# Patient Record
Sex: Male | Born: 1985 | Hispanic: Yes | Marital: Married | Smoking: Never smoker
Health system: Southern US, Community
[De-identification: ages and names within clinical notes are randomized; demographics above are authoritative.]

## PROBLEM LIST (undated history)

## (undated) DIAGNOSIS — J45909 Unspecified asthma, uncomplicated: Secondary | ICD-10-CM

## (undated) DIAGNOSIS — I10 Essential (primary) hypertension: Secondary | ICD-10-CM

## (undated) HISTORY — DX: Unspecified asthma, uncomplicated: J45.909

## (undated) HISTORY — DX: Essential (primary) hypertension: I10

---

## 2017-04-17 ENCOUNTER — Ambulatory Visit (INDEPENDENT_AMBULATORY_CARE_PROVIDER_SITE_OTHER): Payer: BLUE CROSS/BLUE SHIELD | Admitting: Family Medicine

## 2017-04-17 ENCOUNTER — Telehealth: Payer: Self-pay

## 2017-04-17 ENCOUNTER — Ambulatory Visit (HOSPITAL_COMMUNITY)
Admission: RE | Admit: 2017-04-17 | Discharge: 2017-04-17 | Disposition: A | Discharge status: Home / Self Care | Payer: BLUE CROSS/BLUE SHIELD | Source: Ambulatory Visit | Attending: Family Medicine | Admitting: Family Medicine

## 2017-04-17 ENCOUNTER — Encounter: Payer: Self-pay | Admitting: Family Medicine

## 2017-04-17 VITALS — BP 144/80 | HR 87 | Temp 98.4°F | Resp 16 | Ht 71.0 in | Wt 262.0 lb

## 2017-04-17 DIAGNOSIS — I10 Essential (primary) hypertension: Secondary | ICD-10-CM | POA: Diagnosis not present

## 2017-04-17 DIAGNOSIS — S3992XD Unspecified injury of lower back, subsequent encounter: Secondary | ICD-10-CM

## 2017-04-17 DIAGNOSIS — Z23 Encounter for immunization: Secondary | ICD-10-CM | POA: Diagnosis not present

## 2017-04-17 DIAGNOSIS — R809 Proteinuria, unspecified: Secondary | ICD-10-CM

## 2017-04-17 DIAGNOSIS — M545 Low back pain, unspecified: Secondary | ICD-10-CM

## 2017-04-17 DIAGNOSIS — Z131 Encounter for screening for diabetes mellitus: Secondary | ICD-10-CM

## 2017-04-17 DIAGNOSIS — S3992XA Unspecified injury of lower back, initial encounter: Secondary | ICD-10-CM

## 2017-04-17 DIAGNOSIS — X58XXXD Exposure to other specified factors, subsequent encounter: Secondary | ICD-10-CM | POA: Insufficient documentation

## 2017-04-17 LAB — POCT URINALYSIS DIP (DEVICE)
Bilirubin Urine: NEGATIVE
Glucose, UA: NEGATIVE mg/dL
Ketones, ur: NEGATIVE mg/dL
Leukocytes, UA: NEGATIVE
NITRITE: NEGATIVE
Specific Gravity, Urine: 1.025 (ref 1.005–1.030)
Urobilinogen, UA: 0.2 mg/dL (ref 0.0–1.0)
pH: 7 (ref 5.0–8.0)

## 2017-04-17 LAB — POCT GLYCOSYLATED HEMOGLOBIN (HGB A1C): HEMOGLOBIN A1C: 5.2

## 2017-04-17 MED ORDER — MELOXICAM 7.5 MG PO TABS: 7.5000 mg | ORAL_TABLET | Freq: Every day | ORAL | 0 refills | Status: DC

## 2017-04-17 NOTE — Telephone Encounter (Signed)
Called no answer. Left a message for patient to return call and left call back number. Thanks!

## 2017-04-17 NOTE — Telephone Encounter (Signed)
-----   Message from Massie MaroonLachina M Hollis, OregonFNP sent at 04/17/2017  2:35 PM EST ----- Regarding: lab results Patient that x-ray was negative.  We will start a trial of meloxicam 7.5 mg daily.  Follow-up in office as needed for back pain.  He can return to work on Monday, 04/20/2017 with no restrictions   Thanks

## 2017-04-17 NOTE — Progress Notes (Signed)
Chief Complaint  Patient presents with  . Hypertension  . Oral Swelling    swollen "bump" in throat   . Back Pain  . Numbness    in toes      Subjective:    Patient ID: Ricky Velez, male    DOB: 1985-12-05, 32 y.o.   MRN: [id number]  HPI 32 year old male presents to establish care. Patient recently moved [place] from [place]. Patient has not had primary care since relocating to area. He has mostly been utilizing urgent care for all primary needs. He has a history of hypertension. He has been taking losartan-hydrochlorothiazide 100-25 mg daily consistently. He does not exercise routinely or follow a balanced diet. He endorses weight gain over the past several months. He denies headache, blurred vision, shortness of breath, chest pain, nausea, vomiting, or diarrhea.  Patient is also complaining of back pain. He sustained an injury to back 5 days ago.   Patient was pushing a pallet of empty boxes and felt a "shock" to lower back.  Pain intensity 4/5 described as intermitted and aching. Patient says that pain is aggravated by standing, lifting objects, stooping, and bending. He has taken OTC Ibuprofen without sustained relief  Past Medical History:  Diagnosis Date  . Asthma   . Hypertension    Social History   Socioeconomic History  . Marital status: Married    Spouse name: Not on file  . Number of children: Not on file  . Years of education: Not on file  . Highest education level: Not on file  Social Needs  . Financial resource strain: Not on file  . Food insecurity - worry: Not on file  . Food insecurity - inability: Not on file  . Transportation needs - medical: Not on file  . Transportation needs - non-medical: Not on file  Occupational History  . Not on file  Tobacco Use  . Smoking status: Never Smoker  . Smokeless tobacco: Never Used  Substance and Sexual Activity  . Alcohol use: No    Frequency: Never  . Drug use: No  . Sexual activity: Not on file   Other Topics Concern  . Not on file  Social History Narrative  . Not on file   Review of Systems  Constitutional: Negative for fatigue.  HENT: Positive for mouth sores.   Eyes: Negative.   Respiratory: Negative.   Cardiovascular: Negative.   Gastrointestinal: Negative.   Endocrine: Negative.   Genitourinary: Negative.   Musculoskeletal: Positive for back pain.  Allergic/Immunologic: Negative.   Neurological: Negative.   Hematological: Negative.   Psychiatric/Behavioral: Negative.        Objective:   Physical Exam  Constitutional: He is oriented to person, place, and time. He appears well-developed and well-nourished.  HENT:  Head: Normocephalic and atraumatic.  Right Ear: External ear normal.  Left Ear: External ear normal.  Nose: Nose normal.  Mouth/Throat: Oropharynx is clear and moist.  Eyes: Pupils are equal, round, and reactive to light.  Neck: Normal range of motion. Neck supple.  Cardiovascular: Normal rate, regular rhythm, normal heart sounds and intact distal pulses.  Pulmonary/Chest: Effort normal and breath sounds normal.  Abdominal: Soft. Bowel sounds are normal.  Musculoskeletal:       Lumbar back: He exhibits decreased range of motion. He exhibits no swelling, no edema, no pain and no spasm.  Neurological: He is alert and oriented to person, place, and time. He has normal reflexes.  Skin: Skin is warm and  dry.  Psychiatric: He has a normal mood and affect. His behavior is normal. Judgment and thought content normal.      BP (!) 144/80 (BP Location: Left Arm, Patient Position: Sitting, Cuff Size: Large) Comment: manually  Pulse 87   Temp 98.4 F (36.9 C) (Oral)   Resp 16   Ht 5\' 11"  (1.803 m)   Wt 262 lb (118.8 kg)   SpO2 98%   BMI 36.54 kg/m  Assessment & Plan:  1. Essential hypertension Blood pressure is above goal on current medications regimen.  Recommend starting DASH diet - Continue medication, monitor blood pressure at home. Continue DASH  diet. Reminder to go to the ER if any CP, SOB, nausea, dizziness, severe HA, changes vision/speech, left arm numbness and tingling and jaw pain.    - losartan-hydrochlorothiazide (HYZAAR) 100-25 MG tablet; Take 1 tablet by mouth daily. - Ambulatory referral to Ophthalmology - Comprehensive metabolic panel - CBC - Microalbumin/Creatinine Ratio, Urine  2. Screening for diabetes mellitus  - POCT urinalysis dip (device) - HgB A1c  3. Need for Tdap vaccination  - Tdap vaccine greater than or equal to 7yo IM  4. Proteinuria, unspecified type  - losartan-hydrochlorothiazide (HYZAAR) 100-25 MG tablet; Take 1 tablet by mouth daily.  5. Injury of back, subsequent encounter - DG Lumbar Spine Complete; Future - meloxicam (MOBIC) 7.5 MG tablet; Take 1 tablet (7.5 mg total) by mouth daily.  Dispense: 30 tablet; Refill: 0  6. Bilateral low back pain without sciatica, unspecified chronicity - POCT urinalysis dip (device) - meloxicam (MOBIC) 7.5 MG tablet; Take 1 tablet (7.5 mg total) by mouth daily.  Dispense: 30 tablet; Refill: 0   RTC; 3 months for hypetension  Nolon Nations  MSN, FNP-C Patient Care [facility] [address] [place], [place] [address] [phone]

## 2017-04-17 NOTE — Patient Instructions (Addendum)
Low back pain we will start a trial of meloxicam 7.5 mg daily.  Will review x-ray spine and follow-up by phone.  We will also review laboratory values and follow-up by phone.  Recommend back exercises as discussed for back pain related to injury.  Also recommend applying warm moist compresses to lower back as needed.  Recommend a lowfat, low carbohydrate diet divided over 5-6 small meals, increase water intake to 6-8 glasses, and 150 minutes per week of cardiovascular exercise.   Your lab result was negative for type 2 diabetes.  Meloxicam capsules Qu es este medicamento? MELOXICAM es un medicamento antiinflamatorio no esteroideo (AINE). Se utiliza para reducir la inflamacin y Corporate treasurer. Se utiliza para la osteoartritis. Este medicamento puede ser utilizado para otros usos; si tiene alguna pregunta consulte con su proveedor de atencin mdica o con su farmacutico. MARCAS COMUNES: Vivlodex Qu le debo informar a mi profesional de la salud antes de tomar este medicamento? Necesita saber si usted presenta alguno de los siguientes problemas o situaciones: -trastornos sanguneos -fuma cigarrillos -ciruga de bypass coronario con injerto en las ltimas 2 semanas -consume ms de 3 bebidas alcohlicas por da -enfermedad cardiaca -alta presin sangunea -antecedentes de sangrado estomacal -enfermedad renal -enfermedad heptica -enfermedad pulmonar o respiratoria, como asma -problemas intestinales o estomacales -una reaccin alrgica o inusual al meloxicam, a la aspirina, a otros AINEs, otros medicamentos, alimentos, colorantes o conservantes -si est embarazada o buscando quedar embarazada -si est amamantando a un beb Cmo debo utilizar este medicamento? Tome este medicamento por va oral con un vaso lleno de agua. Siga las instrucciones de la etiqueta del [place]. Puede tomarlo con o sin alimentos. Si el Social worker, tmelo con alimentos. Tome su  medicamento a intervalos regulares. No tome su medicamento con una frecuencia mayor a la indicada. No deje de tomarlo excepto si as lo indica su mdico. Su farmacutico le dar una gua del medicamento especial con cada receta y relleno. Asegrese de leer esta informacin cada vez cuidadosamente. Hable con su pediatra para informarse acerca del uso de este medicamento en nios. Puede requerir Customer service manager. Los pacientes mayores de 65 aos de edad pueden presentar reacciones ms fuertes y Pension scheme manager dosis [employer]. Sobredosis: Pngase en contacto inmediatamente con un centro toxicolgico o una sala de urgencia si usted cree que haya tomado demasiado medicamento. ATENCIN: [employer] es solo para usted. No comparta este medicamento con nadie. Qu sucede si me olvido de una dosis? Si se olvida una dosis, tmela lo antes posible. Si es casi la hora de su dosis siguiente, tome slo esa dosis. No tome dosis adicionales o dobles. Qu puede interactuar con este medicamento? No tome esta medicina con ninguno de los siguientes medicamentos: -cidofovir -quetorolac Esta medicina tambin puede interactuar con los siguientes medicamentos: -aspirina y medicamentos tipo aspirina -ciertos medicamentos para la presin sangunea, enfermedad cardiaca, pulso cardiaco irregular -ciertos medicamentos para la depresin, ansiedad o trastornos psicticos -ciertos medicamentos que tratan o previenen cogulos sanguneos, como warfarina, enoxaparina, dalteparina, apixaban, dabigatran, rivaroxaban -ciclosporina -digoxina -diurticos -metotrexato -otros AINEs, medicamentos para el dolor e inflamacin, como ibuprofeno y naproxeno -pemetrexed Puede ser que esta lista no menciona todas las posibles interacciones. Informe a su profesional de [employer] de [employer] Inc productos a base de hierbas, medicamentos de [place] o suplementos nutritivos que est tomando. Si usted fuma, consume bebidas alcohlicas o si utiliza  drogas ilegales, indqueselo tambin a su profesional de [employer]. Algunas sustancias pueden interactuar con su medicamento.  A qu debo estar atento al usar [employer] Corporation? Informe a su mdico o su profesional de la salud si sus sntomas no comienzan a mejorar o si empeoran. No tome otros medicamentos que contienen aspirina, ibuprofeno o naproxeno con [employer]. Efectos secundarios tales como AT[employer], nusea, o lceras pueden ser ms probables que ocurran. No se debe tomar muchos medicamentos de venta libre con [employer] Corporation. Este medicamento puede causar lceras y Landscape architect en el estmago y los intestinos en cualquier momento durante el [place]. Esto puede suceder sin aviso y puede causar la muerte. Riesgo es mayor cuando se toma este medicamento durante mucho tiempo. Fumar, beber alcohol, ser de [address] y [address] tambin pueden [employer]. Llame a su mdico de inmediato si tiene dolor de estmago o sangre en su vmito o heces. Este medicamento no previene ataques al corazn o derrame cerebral. Louann Liv, este medicamento puede aumentar la posibilidad de un ataque al corazn o derrame cerebral. La posibilidad puede aumentar con el uso prolongado de este medicamento y en personas que tienen enfermedades cardiacas. Si usted toma aspirina para prevenir ataques al corazn o derrame cerebral, hable con su mdico o profesional de [employer]. Qu efectos secundarios puedo tener al [employer] este medicamento? Efectos secundarios que debe informar a su mdico o a Producer, television/film/video de la salud tan pronto como sea posible: Therapist, art, como erupcin cutnea, picazn o urticarias, e hinchazn de la cara, los labios o la lengua nuseas, vmito signos y sntomas de un cogulo sanguneo, tales como problemas respiratorios; cambios en la visin; dolor en el pecho; dolor de cabeza severo, repentino; dolor, hinchazn, calor en la pierna; dificultad para hablar; entumecimiento  o debilidad repentina de la cara, el brazo o la pierna signos y sntomas de Landscape architect, tales como heces con Retail buyer o de color negro y Water engineer alquitranado; [place] de color rojo o Child psychotherapist oscuro; escupir sangre o Biomedical engineer que tiene el aspecto de granos de caf molido; Regulatory affairs officer rojas en la piel; sangrado o moretones inusuales en los ojos, las encas o la nariz signos y sntomas de lesin al hgado, como orina amarilla oscura o Child psychotherapist; sensacin general de estar enfermo o sntomas gripales; heces claras; prdida de apetito; nuseas; dolor en la regin abdominal superior derecha; cansancio o debilidad inusual; color amarillento de los ojos o la piel signos y sntomas de un derrame cerebral, tales como cambios en la visin; confusin; dificultad para hablar o entender; dolores de cabeza severos; entumecimiento o debilidad repentina de la cara, el brazo o la pierna; dificultad para caminar; Research scientist (life sciences); prdida del equilibrio o la coordinacin Efectos secundarios que generalmente no requieren Psychologist, prison and probation services (infrmelos a su mdico o a Producer, television/film/video de la salud si persisten o si son molestos): estreimiento diarrea gases Puede ser que esta lista no menciona todos los posibles efectos secundarios. Comunquese a su mdico por asesoramiento mdico [employer]. Usted puede informar los efectos secundarios a la FDA por telfono al 1-800-FDA-1088. Dnde debo guardar mi medicina? Mantngala fuera del alcance de los nios. Gurdela a [employer]I, entre 15 y 30 grados C (86 y 95 grados F). Deseche todo el medicamento que no haya utilizado, despus de la fecha de vencimiento. ATENCIN: Este folleto es un resumen. Puede ser que no cubra toda la posible informacin. Si usted tiene preguntas acerca de esta medicina, consulte con su mdico, su farmacutico o su profesional de Radiographer, therapeutic.  2018 Elsevier/Gold Standard (2015-06-04 00:00:00)  Ejercicios para la espalda (Back  Exercises) Si tiene dolor  de espalda, haga estos ejercicios 2 o 3veces por da, o como se lo haya indicado el mdico. Cuando el dolor desaparezca, hgalos una vez por da, pero haga ms repeticiones de cada ejercicio. Si no le duele la espalda, haga estos ejercicios una vez por da o como se lo haya indicado el mdico. EJERCICIOS Rodilla al pecho Repita estos pasos 3 o 5veces seguidas con cada pierna: 1. Acustese boca arriba sobre una cama dura o sobre el suelo con las piernas extendidas. 2. Lleve una rodilla al pecho. 3. Mantenga la rodilla contra el pecho. Para lograrlo tmese la rodilla o el muslo. 4. Tire de la rodilla hasta sentir una elongacin suave en la parte baja de la espalda. 5. Mantenga la elongacin durante 10 a 30segundos. 6. Suelte y extienda la pierna lentamente. Inclinacin de la pelvis Repita estos pasos 5 o 10veces seguidas: 1. Acustese boca arriba sobre una cama dura o sobre el suelo con las piernas extendidas. 2. Flexione las rodillas de manera que apunten al techo. Los pies deben estar apoyados en el suelo. 3. Contraiga los msculos de la parte baja del vientre (abdomen) para empujar la zona lumbar contra el suelo. Este movimiento har que el cccix apunte hacia el techo, en lugar de apuntar hacia abajo en direccin a los pies o al suelo. 4. Mantenga esta posicin durante 5 a 10segundos mientras contrae suavemente los msculos y respira con normalidad. El perro y el gato Repita estos pasos hasta que la zona lumbar se curve con ms facilidad: 1. Apoye las palmas de las manos y las rodillas sobre una superficie firme. Las manos deben estar alineadas con los hombros y las rodillas con las caderas. Puede colocarse almohadillas debajo de las rodillas. 2. Deje caer la cabeza y lleve el cccix hacia abajo de modo que apunte en direccin al suelo para que la zona lumbar se arquee como el lomo de un gato [place]. 3. Mantenga esta posicin durante 5segundos. 4. Lentamente, levante la cabeza y lleve  el cccix hacia arriba de modo que apunte en direccin al techo para que la espalda se arquee (hunda) como el lomo de un perro contento. 5. Mantenga esta posicin durante 5segundos. Flexiones de brazos Repita estos pasos 5 o 10veces seguidas: 1. Acustese boca abajo en el suelo. 2. Ponga las manos cerca de la cabeza, separadas aproximadamente al ancho de los hombros. 3. Con la espalda relajada y las caderas apoyadas en el suelo, extienda lentamente los brazos para levantar la mitad superior del cuerpo y Optometrist los hombros. No use los msculos de la espalda. Para estar ms cmodo, puede cambiar la International Paper. 4. Mantenga esta posicin durante 5segundos. 5. Lentamente vuelva a la posicin horizontal. Puentes Repita estos pasos 10veces seguidas: 1. Acustese boca arriba sobre una superficie firme. 2. Flexione las rodillas de manera que apunten al techo. Los pies deben estar apoyados en el suelo. 3. Contraiga los glteos y despegue las nalgas del suelo hasta que la cintura est casi a la altura de las rodillas. Si no siente el trabajo muscular en las nalgas y la parte posterior de los muslos, aleje los pies 1 o 2pulgadas (2,5 o 5centmetros) de las nalgas. 4. Mantenga esta posicin durante 3 a 5segundos. 5. Lentamente, vuelva a apoyar las nalgas en el suelo y relaje los glteos. Si este ejercicio le resulta muy fcil, intente realizarlo con los brazos cruzados [place]. Abdominales Repita estos pasos 5 o 10veces seguidas: 1. Acustese  boca arriba sobre una cama dura o sobre el suelo con las piernas extendidas. 2. Flexione las rodillas de manera que apunten al techo. Los pies deben estar apoyados en el suelo. 3. Cruce los World Fuel Services Corporationbrazos sobre el pecho. 4. Baje levemente el mentn en direccin al pecho, pero no doble el cuello. 5. Contraiga los msculos del abdomen y con lentitud eleve el pecho lo suficiente como para despegar levemente los omplatos del suelo. 6. Lentamente baje  el pecho y la cabeza hasta el suelo. Elevaciones de espalda Repita estos pasos 5 o 10veces seguidas: 1. Acustese boca abajo con los brazos a los costados y apoye la frente en el suelo. 2. Contraiga los msculos de las piernas y los glteos. 3. Lentamente despegue el pecho del suelo mientras mantiene las caderas apoyadas en el suelo. Mantenga la nuca alineada con la curvatura de la espalda. Mire hacia el suelo mientras hace este ejercicio. 4. Mantenga esta posicin durante 3 a 5segundos. 5. Lentamente baje el pecho y el rostro hasta el suelo. SOLICITE AYUDA SI:  El dolor de espalda se vuelve mucho ms intenso cuando hace un ejercicio.  El dolor de espalda no se [place] Fasoalivia 2horas despus de ARAMARK Corporationhacer los ejercicios. Si tiene alguno de Limited Brandsestos problemas, deje de ARAMARK Corporationhacer los ejercicios. No vuelva a hacer los ejercicios a menos que el mdico lo autorice. SOLICITE AYUDA DE INMEDIATO SI:  Siente un dolor sbito y muy intenso en la espalda. Si esto ocurre, deje de Toys 'R' Ushacer los ejercicios. No vuelva a hacer los ejercicios a menos que el mdico lo autorice. Esta informacin no tiene Theme park managercomo fin reemplazar el consejo del mdico. Asegrese de hacerle al mdico cualquier pregunta que tenga. Document Released: 06/04/2010 Document Revised: 06/11/2015 Document Reviewed: 04/13/2014 Elsevier Interactive Patient Education  [employer]2018 Elsevier Inc.

## 2017-04-18 LAB — CBC
HEMATOCRIT: 47.7 % (ref 37.5–51.0)
Hemoglobin: 15.6 g/dL (ref 13.0–17.7)
MCH: 29 pg (ref 26.6–33.0)
MCHC: 32.7 g/dL (ref 31.5–35.7)
MCV: 89 fL (ref 79–97)
Platelets: 343 10*3/uL (ref 150–379)
RBC: 5.38 x10E6/uL (ref 4.14–5.80)
RDW: 14.5 % (ref 12.3–15.4)
WBC: 10.6 10*3/uL (ref 3.4–10.8)

## 2017-04-18 LAB — COMPREHENSIVE METABOLIC PANEL
ALBUMIN: 4.7 g/dL (ref 3.5–5.5)
ALT: 34 IU/L (ref 0–44)
AST: 18 IU/L (ref 0–40)
Albumin/Globulin Ratio: 1.6 (ref 1.2–2.2)
Alkaline Phosphatase: 82 IU/L (ref 39–117)
BUN / CREAT RATIO: 20 (ref 9–20)
BUN: 16 mg/dL (ref 6–20)
Bilirubin Total: 0.5 mg/dL (ref 0.0–1.2)
CALCIUM: 9.7 mg/dL (ref 8.7–10.2)
CO2: 25 mmol/L (ref 20–29)
CREATININE: 0.8 mg/dL (ref 0.76–1.27)
Chloride: 97 mmol/L (ref 96–106)
GFR, EST AFRICAN AMERICAN: 136 mL/min/{1.73_m2} (ref >59)
GFR, EST NON AFRICAN AMERICAN: 117 mL/min/{1.73_m2} (ref >59)
GLUCOSE: 75 mg/dL (ref 65–99)
Globulin, Total: 2.9 g/dL (ref 1.5–4.5)
Potassium: 4.4 mmol/L (ref 3.5–5.2)
Sodium: 137 mmol/L (ref 134–144)
Total Protein: 7.6 g/dL (ref 6.0–8.5)

## 2017-04-18 LAB — MICROALBUMIN / CREATININE URINE RATIO
Creatinine, Urine: 116.3 mg/dL
MICROALBUM., U, RANDOM: 1302.3 ug/mL
Microalb/Creat Ratio: 1119.8 mg/g creat — ABNORMAL HIGH (ref 0.0–30.0)

## 2017-04-21 ENCOUNTER — Telehealth: Payer: Self-pay

## 2017-04-21 MED ORDER — LOSARTAN POTASSIUM-HCTZ 100-25 MG PO TABS: 1.0000 | ORAL_TABLET | Freq: Every day | ORAL | 0 refills | Status: DC

## 2017-04-21 NOTE — Telephone Encounter (Signed)
-----   Message from Massie MaroonLachina M Hollis, OregonFNP sent at 04/18/2017 11:05 AM EST ----- Regarding: lab results Please inform patient that all laboratory values are within a normal range. Continue antihypertension medication.   Recommend a lowfat, low carbohydrate diet divided over 5-6 small meals, increase water intake to 6-8 glasses, and 150 minutes per week of cardiovascular exercise.   Thanks

## 2017-04-21 NOTE — Telephone Encounter (Signed)
Called, no answer. Left a message for patient to call back. Thanks!  

## 2017-04-22 NOTE — Telephone Encounter (Signed)
Called using interpreter ID [phone]. Advised that all labs are within a normal range and to continue antihypertension medication as prescribed. Asked that patient eat a low fat/low carb diet over 5 to 6 small meals daily, asked to drink 6 to 8 glasses of water daily and exercise 150 minutes weekly. Thanks!

## 2017-07-17 ENCOUNTER — Ambulatory Visit: Payer: BLUE CROSS/BLUE SHIELD | Admitting: Family Medicine

## 2017-07-24 ENCOUNTER — Encounter: Payer: Self-pay | Admitting: Family Medicine

## 2017-07-24 ENCOUNTER — Ambulatory Visit (INDEPENDENT_AMBULATORY_CARE_PROVIDER_SITE_OTHER): Payer: BLUE CROSS/BLUE SHIELD | Admitting: Family Medicine

## 2017-07-24 VITALS — BP 133/83 | HR 70 | Temp 98.7°F | Resp 16 | Ht 71.0 in | Wt 257.0 lb

## 2017-07-24 DIAGNOSIS — I10 Essential (primary) hypertension: Secondary | ICD-10-CM

## 2017-07-24 DIAGNOSIS — R319 Hematuria, unspecified: Secondary | ICD-10-CM

## 2017-07-24 LAB — POCT URINALYSIS DIPSTICK
Bilirubin, UA: NEGATIVE
Glucose, UA: NEGATIVE
Ketones, UA: NEGATIVE
LEUKOCYTES UA: NEGATIVE
Nitrite, UA: NEGATIVE
PH UA: 6 (ref 5.0–8.0)
Protein, UA: POSITIVE — AB
Spec Grav, UA: >=1.03 — AB (ref 1.010–1.025)
UROBILINOGEN UA: 0.2 U/dL

## 2017-07-24 NOTE — Patient Instructions (Addendum)
Your blood pressure is at goal on today.  I understand that you have not taking medication in 2 days.  Return in 2 weeks after taking medication consistently and we will recheck blood pressure at that time.  I will follow-up by phone with any abnormal laboratory results   - Continue medication, monitor blood pressure at home. Continue DASH diet. Reminder to go to the ER if any CP, SOB, nausea, dizziness, severe HA, changes vision/speech, left arm numbness and tingling and jaw pain.

## 2017-07-25 LAB — BASIC METABOLIC PANEL
BUN/Creatinine Ratio: 17 (ref 9–20)
BUN: 16 mg/dL (ref 6–20)
CALCIUM: 9.4 mg/dL (ref 8.7–10.2)
CHLORIDE: 99 mmol/L (ref 96–106)
CO2: 27 mmol/L (ref 20–29)
Creatinine, Ser: 0.93 mg/dL (ref 0.76–1.27)
GFR calc Af Amer: 124 mL/min/{1.73_m2} (ref >59)
GFR calc non Af Amer: 107 mL/min/{1.73_m2} (ref >59)
Glucose: 82 mg/dL (ref 65–99)
POTASSIUM: 3.9 mmol/L (ref 3.5–5.2)
Sodium: 139 mmol/L (ref 134–144)

## 2017-07-28 NOTE — Progress Notes (Signed)
Ricky Velez, a 32 year old male with a history of hypertension and obesity presents for a follow up of hypertension. He says that he has not been following a low fat diet or exercise routine. He also does not take antihypertensive medication consistently.   Hypertension  This is a chronic problem. The problem is controlled. Pertinent negatives include no blurred vision, chest pain, headaches, malaise/fatigue, neck pain, orthopnea, palpitations, peripheral edema, PND, shortness of breath or sweats. Risk factors for coronary artery disease include obesity and sedentary lifestyle. The current treatment provides moderate improvement. There is no history of chronic renal disease, renovascular disease or sleep apnea.   Past Medical History:  Diagnosis Date  . Asthma   . Hypertension    Social History   Socioeconomic History  . Marital status: Married    Spouse name: Not on file  . Number of children: Not on file  . Years of education: Not on file  . Highest education level: Not on file  Occupational History  . Not on file  Social Needs  . Financial resource strain: Not on file  . Food insecurity:    Worry: Not on file    Inability: Not on file  . Transportation needs:    Medical: Not on file    Non-medical: Not on file  Tobacco Use  . Smoking status: Never Smoker  . Smokeless tobacco: Never Used  Substance and Sexual Activity  . Alcohol use: No    Frequency: Never  . Drug use: No  . Sexual activity: Not on file  Lifestyle  . Physical activity:    Days per week: Not on file    Minutes per session: Not on file  . Stress: Not on file  Relationships  . Social connections:    Talks on phone: Not on file    Gets together: Not on file    Attends religious service: Not on file    Active member of club or organization: Not on file    Attends meetings of clubs or organizations: Not on file    Relationship status: Not on file  . Intimate partner violence:    Fear of current or ex  partner: Not on file    Emotionally abused: Not on file    Physically abused: Not on file    Forced sexual activity: Not on file  Other Topics Concern  . Not on file  Social History Narrative  . Not on file   Immunization History  Administered Date(s) Administered  . Tdap 04/17/2017   No Known Allergies   Review of Systems  Constitutional: Negative for malaise/fatigue.  HENT: Negative.   Eyes: Negative for blurred vision.  Respiratory: Negative.  Negative for shortness of breath.   Cardiovascular: Negative for chest pain, palpitations, orthopnea and PND.  Gastrointestinal: Negative.   Genitourinary: Negative.   Musculoskeletal: Negative.  Negative for neck pain.  Skin: Negative.   Neurological: Negative for headaches.  Endo/Heme/Allergies: Negative.   Psychiatric/Behavioral: Negative.    Physical Exam  Constitutional: He is oriented to person, place, and time. He appears well-developed and well-nourished.  HENT:  Head: Normocephalic and atraumatic.  Eyes: Pupils are equal, round, and reactive to light.  Cardiovascular: Normal rate, regular rhythm, normal heart sounds and intact distal pulses.  Pulmonary/Chest: Effort normal and breath sounds normal.  Musculoskeletal: Normal range of motion.  Neurological: He is alert and oriented to person, place, and time.   Plan   1. Essential hypertension Blood pressure is at goal  on current medication regimen.  Patient advised to take medication consistently in order to achieve positive outcomes.  - Continue medication, monitor blood pressure at home. Continue DASH diet. Reminder to go to the ER if any CP, SOB, nausea, dizziness, severe HA, changes vision/speech, left arm numbness and tingling and jaw pain.    - Urinalysis Dipstick - Basic Metabolic Panel  2. Hematuria, unspecified typ We will review renal functioning as results become available    RTC 2 weeks for blood pressure check, and 3 months for  hypertension    Nolon Nations  MSN, FNP-C Patient [facility] [address] [place], [place] [address] [phone]

## 2017-09-04 ENCOUNTER — Encounter (HOSPITAL_COMMUNITY): Payer: Self-pay | Admitting: Nurse Practitioner

## 2017-09-04 ENCOUNTER — Emergency Department (HOSPITAL_COMMUNITY)
Admission: EM | Admit: 2017-09-04 | Discharge: 2017-09-04 | Disposition: A | Discharge status: Home / Self Care | Payer: BLUE CROSS/BLUE SHIELD | Attending: Emergency Medicine | Admitting: Emergency Medicine

## 2017-09-04 DIAGNOSIS — I10 Essential (primary) hypertension: Secondary | ICD-10-CM | POA: Diagnosis not present

## 2017-09-04 DIAGNOSIS — R51 Headache: Secondary | ICD-10-CM | POA: Diagnosis not present

## 2017-09-04 DIAGNOSIS — R42 Dizziness and giddiness: Secondary | ICD-10-CM

## 2017-09-04 DIAGNOSIS — Z79899 Other long term (current) drug therapy: Secondary | ICD-10-CM | POA: Insufficient documentation

## 2017-09-04 DIAGNOSIS — R531 Weakness: Secondary | ICD-10-CM | POA: Diagnosis present

## 2017-09-04 DIAGNOSIS — R3121 Asymptomatic microscopic hematuria: Secondary | ICD-10-CM

## 2017-09-04 LAB — BASIC METABOLIC PANEL
Anion gap: 9 (ref 5–15)
BUN: 17 mg/dL (ref 6–20)
CALCIUM: 8.9 mg/dL (ref 8.9–10.3)
CHLORIDE: 102 mmol/L (ref 98–111)
CO2: 28 mmol/L (ref 22–32)
CREATININE: 1.04 mg/dL (ref 0.61–1.24)
Glucose, Bld: 113 mg/dL — ABNORMAL HIGH (ref 70–99)
Potassium: 3.5 mmol/L (ref 3.5–5.1)
SODIUM: 139 mmol/L (ref 135–145)

## 2017-09-04 LAB — URINALYSIS, ROUTINE W REFLEX MICROSCOPIC
BILIRUBIN URINE: NEGATIVE
Bacteria, UA: NONE SEEN
GLUCOSE, UA: NEGATIVE mg/dL
KETONES UR: NEGATIVE mg/dL
LEUKOCYTES UA: NEGATIVE
NITRITE: NEGATIVE
PH: 6 (ref 5.0–8.0)
Protein, ur: 100 mg/dL — AB
SPECIFIC GRAVITY, URINE: 1.021 (ref 1.005–1.030)

## 2017-09-04 LAB — CBC
HCT: 45.1 % (ref 39.0–52.0)
Hemoglobin: 15.5 g/dL (ref 13.0–17.0)
MCH: 29.8 pg (ref 26.0–34.0)
MCHC: 34.4 g/dL (ref 30.0–36.0)
MCV: 86.7 fL (ref 78.0–100.0)
PLATELETS: 372 10*3/uL (ref 150–400)
RBC: 5.2 MIL/uL (ref 4.22–5.81)
RDW: 13 % (ref 11.5–15.5)
WBC: 10.9 10*3/uL — AB (ref 4.0–10.5)

## 2017-09-04 LAB — CBG MONITORING, ED: Glucose-Capillary: 111 mg/dL — ABNORMAL HIGH (ref 70–99)

## 2017-09-04 MED ORDER — MECLIZINE HCL 25 MG PO TABS
25.0000 mg | ORAL_TABLET | Freq: Once | ORAL | Status: AC
  Administered 2017-09-04: 25 mg via ORAL
  Filled 2017-09-04: qty 1

## 2017-09-04 MED ORDER — IBUPROFEN 800 MG PO TABS
800.0000 mg | ORAL_TABLET | Freq: Once | ORAL | Status: AC
  Administered 2017-09-04: 800 mg via ORAL
  Filled 2017-09-04: qty 1

## 2017-09-04 MED ORDER — MECLIZINE HCL 25 MG PO TABS: 25.0000 mg | ORAL_TABLET | Freq: Three times a day (TID) | ORAL | 0 refills | Status: DC | PRN

## 2017-09-04 MED ORDER — ACETAMINOPHEN 500 MG PO TABS
1000.0000 mg | ORAL_TABLET | Freq: Once | ORAL | Status: AC
  Administered 2017-09-04: 1000 mg via ORAL
  Filled 2017-09-04: qty 2

## 2017-09-04 MED ORDER — ONDANSETRON 8 MG PO TBDP
8.0000 mg | ORAL_TABLET | Freq: Once | ORAL | Status: AC
  Administered 2017-09-04: 8 mg via ORAL
  Filled 2017-09-04: qty 1

## 2017-09-04 NOTE — ED Notes (Signed)
Pt provided w/labeled specimen cup for U/A...advised to collect and give to ED staff for analysis when possible. [employer]

## 2017-09-04 NOTE — ED Triage Notes (Signed)
Pt is c/o generalized weakness associated with headache and dizziness that he describes as room spinning. Denies chest pain, shortness of breath, malaise but endorses mild nausea and a lack of appetite. Onset of symptoms her reports as this morning.

## 2017-09-04 NOTE — ED Notes (Signed)
Pt reports that dizziness is much better after medication. Pt reports that he still is a little dizzy, but better.

## 2017-09-04 NOTE — ED Provider Notes (Signed)
[facility]-EMERGENCY DEPT Provider Note   CSN: [id number] Arrival date & time: 09/04/17  1506     History   Chief Complaint Chief Complaint  Patient presents with  . Weakness  . Dizziness    HPI Ricky Velez is a 32 y.o. male.  32 year old male with past medical history including hypertension and asthma who presents with dizziness and headache.  This morning the patient was in bed and was awakened with sudden onset of dizziness that he describes as room spinning sensation.  His symptoms seem to be worse with head movements particularly with turning his head to the left.  The episodes seem to last 20 or 30 seconds.  He has nausea following the episodes but has had no vomiting.  He also had a gradual onset of headache this morning that is currently moderate and constant.  He has had some generalized weakness and decreased appetite today.  No fevers, cough/cold symptoms, tinnitus, vision problems, chest pain, shortness of breath, or recent illness.  No medications prior to arrival.  The history is provided by the patient.    Past Medical History:  Diagnosis Date  . Asthma   . Hypertension     Patient Active Problem List   Diagnosis Date Noted  . Essential hypertension 04/17/2017  . Proteinuria 04/17/2017    History reviewed. No pertinent surgical history.      Home Medications    Prior to Admission medications   Medication Sig Start Date End Date Taking? Authorizing Provider  losartan-hydrochlorothiazide (HYZAAR) 100-25 MG tablet Take 1 tablet by mouth daily. 04/21/17  Yes Massie MaroonHollis, Lachina M, FNP  meclizine (ANTIVERT) 25 MG tablet Take 1 tablet (25 mg total) by mouth 3 (three) times daily as needed for dizziness. 09/04/17   Fidel Caggiano, Ambrose Finlandachel Morgan, MD  meloxicam (MOBIC) 7.5 MG tablet Take 1 tablet (7.5 mg total) by mouth daily. Patient not taking: Reported on 07/24/2017 04/17/17   Massie MaroonHollis, Lachina M, FNP    Family History Family History  Problem  Relation Age of Onset  . Hypertension Mother   . Diabetes Father   . Hypertension Father   . Diabetes Maternal Grandmother   . Hypertension Maternal Grandfather   . Diabetes Paternal Grandmother   . Hypertension Paternal Grandfather     Social History Social History   Tobacco Use  . Smoking status: Never Smoker  . Smokeless tobacco: Never Used  Substance Use Topics  . Alcohol use: No    Frequency: Never  . Drug use: No     Allergies   Patient has no known allergies.   Review of Systems Review of Systems All other systems reviewed and are negative except that which was mentioned in HPI   Physical Exam Updated Vital Signs BP 118/83   Pulse 68   Temp 98.1 F (36.7 C) (Oral)   Resp 19   SpO2 100%   Physical Exam  Constitutional: He is oriented to person, place, and time. He appears well-developed and well-nourished. No distress.  Awake, alert Uncomfortable, eyes closed  HENT:  Head: Normocephalic and atraumatic.  Eyes: Pupils are equal, round, and reactive to light. Conjunctivae and EOM are normal.  Neck: Neck supple.  Cardiovascular: Normal rate, regular rhythm and normal heart sounds.  No murmur heard. Pulmonary/Chest: Effort normal and breath sounds normal. No respiratory distress.  Abdominal: Soft. Bowel sounds are normal. He exhibits no distension. There is no tenderness.  Musculoskeletal: He exhibits no edema.  Neurological: He is alert and oriented  to person, place, and time. He has normal reflexes. No cranial nerve deficit. He exhibits normal muscle tone.  Fluent speech, normal finger-to-nose testing, negative pronator drift, no clonus 5/5 strength and normal sensation x all 4 extremities  Skin: Skin is warm and dry.  Psychiatric: He has a normal mood and affect. Judgment and thought content normal.  Nursing note and vitals reviewed.    ED Treatments / Results  Labs (all labs ordered are listed, but only abnormal results are displayed) Labs  Reviewed  BASIC METABOLIC PANEL - Abnormal; Notable for the following components:      Result Value   Glucose, Bld 113 (*)    All other components within normal limits  CBC - Abnormal; Notable for the following components:   WBC 10.9 (*)    All other components within normal limits  URINALYSIS, ROUTINE W REFLEX MICROSCOPIC - Abnormal; Notable for the following components:   Hgb urine dipstick MODERATE (*)    Protein, ur 100 (*)    All other components within normal limits  CBG MONITORING, ED - Abnormal; Notable for the following components:   Glucose-Capillary 111 (*)    All other components within normal limits    EKG EKG Interpretation  Date/Time:  Friday September 04 2017 15:28:18 EDT Ventricular Rate:  77 PR Interval:    QRS Duration: 114 QT Interval:  375 QTC Calculation: 425 R Axis:   -2 Text Interpretation:  Sinus rhythm Borderline intraventricular conduction delay RSR' in V1 or V2, right VCD or RVH ST elevation suggests acute pericarditis Baseline wander in lead(s) V2 No previous ECGs available Confirmed by Frederick Peers [phone]) on 09/04/2017 6:10:23 PM   Radiology No results found.  Procedures Procedures (including critical care time)  Medications Ordered in ED Medications  meclizine (ANTIVERT) tablet 25 mg (25 mg Oral Given 09/04/17 1925)  ondansetron (ZOFRAN-ODT) disintegrating tablet 8 mg (8 mg Oral Given 09/04/17 1925)  acetaminophen (TYLENOL) tablet 1,000 mg (1,000 mg Oral Given 09/04/17 1925)  ibuprofen (ADVIL,MOTRIN) tablet 800 mg (800 mg Oral Given 09/04/17 1925)     Initial Impression / Assessment and Plan / ED Course  I have reviewed the triage vital signs and the nursing notes.  Pertinent labs that were available during my care of the patient were reviewed by me and considered in my medical decision making (see chart for details).     Patient with reassuring vital signs.  Normal neurologic exam.  I performed Epley maneuver to try to improve his symptoms.   Gave Zofran, meclizine, Tylenol, and Motrin.  Labs obtained from triage are unremarkable, no evidence of dehydration.  After receiving above medications, patient was well-appearing on reassessment and stated that his symptoms had resolved.  Given normal neurologic exam and episodic vertigo symptoms provoked by head movements, I suspect peripheral vertigo.  I have extensively reviewed return precautions with him. He felt comfortable going home.  I have instructed him to follow-up with his PCP; also had microscopic hematuria and made recommendations to repeat urine.   Final Clinical Impressions(s) / ED Diagnoses   Final diagnoses:  Asymptomatic microscopic hematuria  Vertigo    ED Discharge Orders        Ordered    meclizine (ANTIVERT) 25 MG tablet  3 times daily PRN     09/04/17 2143       Laurenashley Viar, Ambrose Finland, MD 09/05/17 0021

## 2017-10-26 ENCOUNTER — Ambulatory Visit: Payer: BLUE CROSS/BLUE SHIELD | Admitting: Family Medicine

## 2018-11-10 ENCOUNTER — Encounter (HOSPITAL_COMMUNITY): Payer: Self-pay

## 2018-11-10 ENCOUNTER — Encounter (HOSPITAL_COMMUNITY): Payer: Self-pay | Admitting: *Deleted

## 2018-11-23 ENCOUNTER — Ambulatory Visit (HOSPITAL_COMMUNITY)
Admission: EM | Admit: 2018-11-23 | Discharge: 2018-11-23 | Disposition: A | Discharge status: Home / Self Care | Payer: 59 | Attending: Urgent Care | Admitting: Urgent Care

## 2018-11-23 ENCOUNTER — Encounter (HOSPITAL_COMMUNITY): Payer: Self-pay | Admitting: Urgent Care

## 2018-11-23 DIAGNOSIS — I10 Essential (primary) hypertension: Secondary | ICD-10-CM | POA: Diagnosis not present

## 2018-11-23 DIAGNOSIS — R112 Nausea with vomiting, unspecified: Secondary | ICD-10-CM | POA: Insufficient documentation

## 2018-11-23 DIAGNOSIS — Z79899 Other long term (current) drug therapy: Secondary | ICD-10-CM | POA: Diagnosis not present

## 2018-11-23 DIAGNOSIS — R6883 Chills (without fever): Secondary | ICD-10-CM | POA: Diagnosis present

## 2018-11-23 DIAGNOSIS — R51 Headache: Secondary | ICD-10-CM | POA: Diagnosis not present

## 2018-11-23 DIAGNOSIS — R1084 Generalized abdominal pain: Secondary | ICD-10-CM

## 2018-11-23 DIAGNOSIS — Z20822 Contact with and (suspected) exposure to covid-19: Secondary | ICD-10-CM

## 2018-11-23 DIAGNOSIS — R197 Diarrhea, unspecified: Secondary | ICD-10-CM | POA: Insufficient documentation

## 2018-11-23 DIAGNOSIS — B349 Viral infection, unspecified: Secondary | ICD-10-CM | POA: Diagnosis not present

## 2018-11-23 DIAGNOSIS — Z20828 Contact with and (suspected) exposure to other viral communicable diseases: Secondary | ICD-10-CM | POA: Insufficient documentation

## 2018-11-23 DIAGNOSIS — Z791 Long term (current) use of non-steroidal anti-inflammatories (NSAID): Secondary | ICD-10-CM | POA: Diagnosis not present

## 2018-11-23 DIAGNOSIS — R519 Headache, unspecified: Secondary | ICD-10-CM

## 2018-11-23 MED ORDER — ONDANSETRON 4 MG PO TBDP
ORAL_TABLET | ORAL | Status: AC
  Filled 2018-11-23: qty 2

## 2018-11-23 MED ORDER — ONDANSETRON 8 MG PO TBDP: 8.0000 mg | ORAL_TABLET | Freq: Three times a day (TID) | ORAL | 0 refills | Status: DC | PRN

## 2018-11-23 MED ORDER — ONDANSETRON 4 MG PO TBDP
8.0000 mg | ORAL_TABLET | Freq: Once | ORAL | Status: AC
  Administered 2018-11-23: 8 mg via ORAL

## 2018-11-23 NOTE — ED Triage Notes (Signed)
Patient report he is having headache for 3 days, abdominal pain, diarrhea, nausea, chills for 1 day. 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

## 2018-11-23 NOTE — ED Provider Notes (Signed)
MRN: [id number] DOB: 06/15/1985  Subjective:   Ricky Velez is a 33 y.o. male presenting for 3-day history of acute onset moderate to severe worsening malaise, chills, nausea with vomiting and diarrhea. Has tried [employer], Imodium. Denies smoking cigarettes. Had asthma as a child.  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  No current facility-administered medications for this encounter.   Current Outpatient Medications:  .  losartan-hydrochlorothiazide (HYZAAR) 100-25 MG tablet, Take 1 tablet by mouth daily., Disp: 90 tablet, Rfl: 0 .  meclizine (ANTIVERT) 25 MG tablet, Take 1 tablet (25 mg total) by mouth 3 (three) times daily as needed for dizziness., Disp: 8 tablet, Rfl: 0 .  meloxicam (MOBIC) 7.5 MG tablet, Take 1 tablet (7.5 mg total) by mouth daily. (Patient not taking: Reported on 07/24/2017), Disp: 30 tablet, Rfl: 0    No Known Allergies   Past Medical History:  Diagnosis Date  . Hypertension      History reviewed. No pertinent surgical history.  Review of Systems  Constitutional: Positive for chills and malaise/fatigue. Negative for fever.  HENT: Positive for sore throat. Negative for congestion, ear pain and sinus pain.   Eyes: Negative for discharge and redness.  Respiratory: Negative for cough, hemoptysis, shortness of breath and wheezing.   Cardiovascular: Negative for chest pain.  Gastrointestinal: Positive for abdominal pain, diarrhea (~6 episodes yesterday), nausea and vomiting (~4 episodes in the past day). Negative for blood in stool and constipation.  Genitourinary: Negative for dysuria, flank pain and hematuria.  Musculoskeletal: Positive for myalgias.  Skin: Negative for rash.  Neurological: Positive for headaches. Negative for dizziness and weakness.  Psychiatric/Behavioral: Negative for depression and substance abuse.    Objective:   Vitals: BP (!) 155/101 (BP Location: Right Arm)   Pulse 84    Temp 98.2 F (36.8 C) (Temporal)   Resp 18   SpO2 99%   Physical Exam Constitutional:      General: He is not in acute distress.    Appearance: Normal appearance. He is well-developed and normal weight. He is not ill-appearing, toxic-appearing or diaphoretic.  HENT:     Head: Normocephalic and atraumatic.     Right Ear: External ear normal.     Left Ear: External ear normal.     Nose: Nose normal.     Mouth/Throat:     Mouth: Mucous membranes are moist.     Pharynx: Oropharynx is clear.  Eyes:     General: No scleral icterus.    Extraocular Movements: Extraocular movements intact.     Pupils: Pupils are equal, round, and reactive to light.  Cardiovascular:     Rate and Rhythm: Normal rate and regular rhythm.     Heart sounds: Normal heart sounds. No murmur. No friction rub. No gallop.   Pulmonary:     Effort: Pulmonary effort is normal. No respiratory distress.     Breath sounds: Normal breath sounds. No stridor. No wheezing, rhonchi or rales.  Abdominal:     General: Bowel sounds are normal. There is no distension.     Palpations: Abdomen is soft. There is no mass.     Tenderness: There is no abdominal tenderness. There is no guarding or rebound.  Skin:    General: Skin is warm and dry.  Neurological:     Mental Status: He is alert and oriented to person, place, and time.  Psychiatric:        Mood and Affect: Mood normal.  Behavior: Behavior normal.        Thought Content: Thought content normal.     Assessment and Plan :   1. Viral syndrome   2. Chills   3. Generalized abdominal pain   4. Nausea vomiting and diarrhea   5. Acute nonintractable headache, unspecified headache type   6. 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 Counseled patient on nature of COVID-19 including modes of transmission, diagnostic testing, management and supportive care.  Offered  symptomatic relief with Zofran and also use Imodium as needed. COVID 19 testing is pending. Counseled patient on potential for adverse effects with medications prescribed/recommended today, ER and return-to-clinic precautions discussed, patient verbalized understanding.     Wallis Bamberg, [place] 11/23/18 [phone]

## 2018-11-23 NOTE — Discharge Instructions (Addendum)
We will manage this as a viral syndrome. For sore throat or cough try using a honey-based tea. Use 3 teaspoons of honey with juice squeezed from half lemon. Place shaved pieces of ginger into 1/2-1 cup of water and warm over stove top. Then mix the ingredients and repeat every 4 hours as needed. Please take Tylenol 500mg  every 6 hours. Hydrate very well with at least 2 liters of water. Eat light meals such as soups to replenish electrolytes and soft fruits, veggies. Start an antihistamine like Zyrtec, Allegra or Claritin for postnasal drainage, sinus congestion.  You can take this together with pseudoephedrine (Sudafed) at a dose of 60 mg 3 times a day.

## 2018-11-24 LAB — NOVEL CORONAVIRUS, NAA (HOSP ORDER, SEND-OUT TO REF LAB; TAT 18-24 HRS): SARS-CoV-2, NAA: NOT DETECTED

## 2019-01-04 ENCOUNTER — Ambulatory Visit: Payer: 59 | Admitting: Emergency Medicine

## 2019-01-06 ENCOUNTER — Encounter: Payer: Self-pay | Admitting: Emergency Medicine

## 2019-03-27 ENCOUNTER — Other Ambulatory Visit: Payer: Self-pay

## 2019-03-27 ENCOUNTER — Ambulatory Visit (HOSPITAL_COMMUNITY)
Admission: EM | Admit: 2019-03-27 | Discharge: 2019-03-27 | Disposition: A | Discharge status: Home / Self Care | Payer: 59

## 2019-03-27 ENCOUNTER — Encounter (HOSPITAL_COMMUNITY): Payer: Self-pay

## 2019-03-27 DIAGNOSIS — R079 Chest pain, unspecified: Secondary | ICD-10-CM | POA: Diagnosis not present

## 2019-03-27 NOTE — ED Triage Notes (Signed)
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 Pt works as a Radiation protection practitioner. Pt's BP was 175/122 at that time and he states he took 4 ASA. C/o HA  And mid-sternal cp earlier. States CP has since resolved but wanted an EKG. Pt states still feels "weak". Denies diaphoresis, nausea, dizziness, blurred vision, or slurred speech.  VS obtained and EKG performed and results given to Dr. Chaney Malling.

## 2019-03-27 NOTE — ED Provider Notes (Addendum)
[facility]    CSN: [id number] Arrival date & time: 03/27/19  1019      History   Chief Complaint Chief Complaint  Patient presents with  . Chest Pain    HPI Ricky Velez is a 34 y.o. male history of hypertension, asthma, presenting today for evaluation of chest pain.  This morning around 730 he had approximately 2 to 3 minutes of chest pain which was located in his left lower sternal border area.  This resolved with rest.  He denies any current chest pain.  He had associated headache, but denied associated nausea, vomiting, shortness of breath.  He does feel that he is felt more fatigued and winded with short distances compared to previously.  He notes that he had Covid early in December and has experienced this on occasion.  He takes losartan/hydrochlorothiazide for blood pressure and is typically 130 systolic in 80s diastolic.  He checked his blood pressure earlier today and was 175/122.  He took 4 aspirin earlier.  He typically is prescribed his medicine in [place]  He denies any personal history of diabetes.  Denies history of tobacco use.  Denies prior DVT/PE.  Denies leg pain or leg swelling.  Denies recent surgeries, patient has traveled to [place] approximately 2 weeks ago, but denies any shortness of breath since.  HPI  Past Medical History:  Diagnosis Date  . Asthma   . Hypertension     Patient Active Problem List   Diagnosis Date Noted  . Essential hypertension 04/17/2017  . Proteinuria 04/17/2017    History reviewed. No pertinent surgical history.     Home Medications    Prior to Admission medications   Medication Sig Start Date End Date Taking? Authorizing Provider  losartan-hydrochlorothiazide (HYZAAR) 100-25 MG tablet Take 1 tablet by mouth daily. 04/21/17  Yes Massie Maroon, FNP    Family History Family History  Problem Relation Age of Onset  . Hypertension Mother   . Diabetes Father   . Hypertension Father   .  Diabetes Maternal Grandmother   . Hypertension Maternal Grandfather   . Diabetes Paternal Grandmother   . Hypertension Paternal Grandfather     Social History Social History   Tobacco Use  . Smoking status: Never Smoker  . Smokeless tobacco: Never Used  Substance Use Topics  . Alcohol use: Yes  . Drug use: No     Allergies   Patient has no known allergies.   Review of Systems Review of Systems  Constitutional: Negative for fatigue and fever.  HENT: Negative for congestion, sinus pressure and sore throat.   Eyes: Negative for photophobia, pain and visual disturbance.  Respiratory: Negative for cough and shortness of breath.   Cardiovascular: Positive for chest pain. Negative for leg swelling.  Gastrointestinal: Negative for abdominal pain, nausea and vomiting.  Genitourinary: Negative for decreased urine volume and hematuria.  Musculoskeletal: Negative for myalgias, neck pain and neck stiffness.  Neurological: Positive for headaches. Negative for dizziness, syncope, facial asymmetry, speech difficulty, weakness, light-headedness and numbness.     Physical Exam Triage Vital Signs ED Triage Vitals  Enc Vitals Group     BP 03/27/19 1037 (!) 163/98     Pulse --      Resp 03/27/19 1037 (!) 24     Temp 03/27/19 1037 98.4 F (36.9 C)     Temp src --      SpO2 03/27/19 1037 100 %     Weight --  Height --      Head Circumference --      Peak Flow --      Pain Score 03/27/19 1043 0     Pain Loc --      Pain Edu? --      Excl. in GC? --    No data found.  Updated Vital Signs BP (!) 163/98 (BP Location: Right Arm)   Pulse 72   Temp 98.4 F (36.9 C)   Resp (!) 24   SpO2 100%   Visual Acuity Right Eye Distance:   Left Eye Distance:   Bilateral Distance:    Right Eye Near:   Left Eye Near:    Bilateral Near:     Physical Exam Vitals and nursing note reviewed.  Constitutional:      Appearance: He is well-developed.  HENT:     Head: Normocephalic and  atraumatic.     Ears:     Comments: Bilateral ears without tenderness to palpation of external auricle, tragus and mastoid, EAC's without erythema or swelling, TM's with good bony landmarks and cone of light. Non erythematous.    Mouth/Throat:     Comments: Oral mucosa pink and moist, no tonsillar enlargement or exudate. Posterior pharynx patent and nonerythematous, no uvula deviation or swelling. Normal phonation. Eyes:     Extraocular Movements: Extraocular movements intact.     Conjunctiva/sclera: Conjunctivae normal.     Pupils: Pupils are equal, round, and reactive to light.  Cardiovascular:     Rate and Rhythm: Normal rate and regular rhythm.     Heart sounds: No murmur.     Comments: No carotid bruits auscultated Pulmonary:     Effort: Pulmonary effort is normal. No respiratory distress.     Breath sounds: Normal breath sounds.     Comments: Breathing comfortably at rest, CTABL, no wheezing, rales or other adventitious sounds auscultated  Abdominal:     Palpations: Abdomen is soft.     Tenderness: There is no abdominal tenderness.  Musculoskeletal:     Cervical back: Neck supple.     Comments: No anterior chest tenderness  Bilateral lower leg symmetric, no swelling noted, no calf tenderness to palpation  Skin:    General: Skin is warm and dry.  Neurological:     General: No focal deficit present.     Mental Status: He is alert and oriented to person, place, and time. Mental status is at baseline.     Cranial Nerves: No cranial nerve deficit.      UC Treatments / Results  Labs (all labs ordered are listed, but only abnormal results are displayed) Labs Reviewed - No data to display  EKG   Radiology No results found.  Procedures Procedures (including critical care time)  Medications Ordered in UC Medications - No data to display  Initial Impression / Assessment and Plan / UC Course  I have reviewed the triage vital signs and the nursing notes.  Pertinent  labs & imaging results that were available during my care of the patient were reviewed by me and considered in my medical decision making (see chart for details).  Clinical Course as of Mar 26 1114  Sun Mar 27, 2019  1116 HR checked, 89   [HW]    Clinical Course User Index [HW] Jamill Wetmore, [place] C, [place]    Patient denies any current symptoms, including denying any chest pain or shortness of breath.  Symptoms concerning for stable angina, will have follow-up with cardiology outpatient.  Main risk factor for DVT/PE is recent Covid and travel to [place] recently.  Vital signs reassuring today, not tachycardic.  No sign of DVT on exam.  EKG normal sinus rhythm, comparable to prior EKG in 2019.  No acute signs of ischemia or infarction.  Discussed with patient unable to 100% rule out chest pain cardiac etiology, but seems stable for outpatient follow-up with cardiology.  Advised if chest pain returning or developing any difficulty breathing, signs of DVT to follow-up in emergency room.  Continue to monitor blood pressure and take medicine, [place] care [place]  Discussed strict return precautions. Patient verbalized understanding and is agreeable with plan.  Final Clinical Impressions(s) / UC Diagnoses   Final diagnoses:  Chest pain, unspecified type     Discharge Instructions     Please continue to monitor your symptoms, if you develop returning or worsening chest pain, difficulty breathing, shortness of breath, nausea, dizziness, lightheadedness, vision changes, headache, please follow-up in the emergency room Continue to monitor blood pressure and take medicine as prescribed Please follow-up with cardiology and set up a primary care     ED Prescriptions    None     PDMP not reviewed this encounter.   Janith Lima, PA-C 03/27/19 1116    Erland Vivas, Deary C, PA-C 03/27/19 1116

## 2019-03-27 NOTE — Discharge Instructions (Signed)
Please continue to monitor your symptoms, if you develop returning or worsening chest pain, difficulty breathing, shortness of breath, nausea, dizziness, lightheadedness, vision changes, headache, please follow-up in the emergency room Continue to monitor blood pressure and take medicine as prescribed Please follow-up with cardiology and set up a primary care

## 2019-04-04 ENCOUNTER — Encounter: Payer: Self-pay | Admitting: Family Medicine

## 2019-04-04 ENCOUNTER — Other Ambulatory Visit: Payer: Self-pay

## 2019-04-04 ENCOUNTER — Ambulatory Visit: Payer: 59 | Admitting: Family Medicine

## 2019-04-04 ENCOUNTER — Ambulatory Visit (INDEPENDENT_AMBULATORY_CARE_PROVIDER_SITE_OTHER): Payer: 59

## 2019-04-04 VITALS — BP 148/102 | HR 95 | Temp 97.3°F | Ht 71.0 in | Wt 278.0 lb

## 2019-04-04 DIAGNOSIS — Z13220 Encounter for screening for lipoid disorders: Secondary | ICD-10-CM | POA: Diagnosis not present

## 2019-04-04 DIAGNOSIS — R079 Chest pain, unspecified: Secondary | ICD-10-CM

## 2019-04-04 DIAGNOSIS — I10 Essential (primary) hypertension: Secondary | ICD-10-CM | POA: Diagnosis not present

## 2019-04-04 MED ORDER — AMLODIPINE BESYLATE 5 MG PO TABS
5.0000 mg | ORAL_TABLET | Freq: Every day | ORAL | 1 refills | Status: DC
Start: 2019-04-04 — End: 2020-11-15

## 2019-04-04 NOTE — Progress Notes (Signed)
Subjective:  Patient ID: Ricky Velez, male    DOB: 08/13/1985  Age: 34 y.o. MRN: [id number]  CC:  Chief Complaint  Patient presents with  . Establish Care    pt states he feels good as far as his general health. pt is concered about his BP. pt states he has been taking his current hypertention medication for 3 years no, but resently when he checks his BP its high despite regular taking of his medication. pt was resently at the E.D. because pf his hypertention and pt had some chest pain.    HPI Ricky Velez presents for   Establish care, ER follow up,  history of hypertension.  Reports current medication for the past 3 years.  Recently went to emergency department because of hypertension and chest pain, HA.  [facility] eval on 1/24. Note reviewed. Headache, CP that morning, improved by time went to urgent care. BP 175/112 that day (usually 130/80's) BP 163/98 at Urgent Care. EKG: Plan for outpatient follow up with cardiology.  No personal history of heart disease, but FH of heart disease - mom with stent in 40's.  Alcohol: rare - no recent use.  Tobacco: none, no IDU.   No recent chest pains.  Only 1 headache since Urgent care visit.  No missed doses of meds, on losartan hct 100/25 mg qd - same dose for 2 years.  Recent BP: 138/90's - few days ago.  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    History Patient Active Problem List   Diagnosis Date Noted  . Essential hypertension 04/17/2017  . Proteinuria 04/17/2017   Past Medical History:  Diagnosis Date  . Asthma   . Hypertension    History reviewed. No pertinent surgical history. No Known Allergies Prior to Admission medications   Medication Sig Start Date End Date Taking? Authorizing Provider  losartan-hydrochlorothiazide (HYZAAR) 100-25 MG tablet Take 1 tablet by mouth daily. 04/21/17  Yes Massie Maroon, FNP   Social History   Socioeconomic History  . Marital  status: Married    Spouse name: Not on file  . Number of children: Not on file  . Years of education: Not on file  . Highest education level: Not on file  Occupational History  . Not on file  Tobacco Use  . Smoking status: Never Smoker  . Smokeless tobacco: Never Used  Substance and Sexual Activity  . Alcohol use: Yes  . Drug use: No  . Sexual activity: Yes  Other Topics Concern  . Not on file  Social History Narrative  . Not on file   Social Determinants of Health   Financial Resource Strain:   . Difficulty of Paying Living Expenses: Not on file  Food Insecurity:   . Worried About Programme researcher, broadcasting/film/video in the Last Year: Not on file  . Ran Out of Food in the Last Year: Not on file  Transportation Needs:   . Lack of Transportation (Medical): Not on file  . Lack of Transportation (Non-Medical): Not on file  Physical Activity:   . Days of Exercise per Week: Not on file  . Minutes of Exercise per Session: Not on file  Stress:   . Feeling of Stress : Not on file  Social Connections:   . Frequency of Communication with Friends and Family: Not on file  . Frequency of Social Gatherings with Friends and Family: Not on file  . Attends Religious Services: Not on  file  . Active Member of Clubs or Organizations: Not on file  . Attends BankerClub or Organization Meetings: Not on file  . Marital Status: Not on file  Intimate Partner Violence:   . Fear of Current or Ex-Partner: Not on file  . Emotionally Abused: Not on file  . Physically Abused: Not on file  . Sexually Abused: Not on file    Review of Systems  Per HPI. Objective:   Vitals:   04/04/19 1455  BP: (!) 148/102  Pulse: 95  Temp: (!) 97.3 F (36.3 C)  TempSrc: Temporal  SpO2: 100%  Weight: 278 lb (126.1 kg)  Height: 5\' 11"  (1.803 m)     Physical Exam Vitals reviewed.  Constitutional:      Appearance: He is well-developed.  HENT:     Head: Normocephalic and atraumatic.  Eyes:     Pupils: Pupils are equal,  round, and reactive to light.  Neck:     Vascular: No carotid bruit or JVD.  Cardiovascular:     Rate and Rhythm: Normal rate and regular rhythm.     Heart sounds: Normal heart sounds. No murmur.  Pulmonary:     Effort: Pulmonary effort is normal.     Breath sounds: Normal breath sounds. No rales.  Skin:    General: Skin is warm and dry.  Neurological:     Mental Status: He is alert and oriented to person, place, and time.      .  EKG on 03/27/19- read as NSR, normal. On my reading  - nonspecific ST segment II, III, AVF.   EKG today: RSR'V1, no acute findings.   DG Chest 2 View  Result Date: 04/04/2019 CLINICAL DATA:  Hypertension.  Chest pain. EXAM: CHEST - 2 VIEW COMPARISON:  None. FINDINGS: The heart size and mediastinal contours are within normal limits. Both lungs are clear. The visualized skeletal structures are unremarkable. IMPRESSION: No active cardiopulmonary disease. Electronically Signed   By: Sherian ReinWei-Chen  Lin M.D.   On: 04/04/2019 16:28      Assessment & Plan:  Ricky Velez is a 34 y.o. male . Chest pain, unspecified type - Plan: amLODipine (NORVASC) 5 MG tablet, TSH, CBC, EKG 12-Lead, DG Chest 2 View, Ambulatory referral to Cardiology, CANCELED: Basic metabolic panel  -Asymptomatic at present.  Possible incomplete right bundle branch block on EKG without other acute findings.  Family history of early CAD.  Cardiology referral with ER/RTC precautions given.   essential hypertension - Plan: amLODipine (NORVASC) 5 MG tablet, TSH, CBC, EKG 12-Lead, DG Chest 2 View, Comprehensive metabolic panel, Lipid panel, CANCELED: Basic metabolic panel  -Previously controlled, now with hypertensive urgency, persistent elevated readings.  -Check labs above for secondary hypertension causes.  Cardiology follow-up as above.  Add amlodipine 5 mg daily, continue losartan HCTZ 100/25 mg daily.  Screening for hyperlipidemia - Plan: Lipid panel  -Screen lipids for risk stratification  with family history of CAD.  Meds ordered this encounter  Medications  . amLODipine (NORVASC) 5 MG tablet    Sig: Take 1 tablet (5 mg total) by mouth daily.    Dispense:  90 tablet    Refill:  1   Patient Instructions    I will check some labs with the recent increase in blood pressure.  Additionally follow-up with cardiology to discuss the previous chest pain as well as your family history.  I will check some screening labs today, add amlodipine 5 mg once per day to the losartan HCTZ and keep  a record of your home blood pressures.  Can discuss results as well as blood pressure control at follow-up in 2 weeks.  Thank you for coming in today.  Let me know if there are questions.   Return to the clinic or go to the nearest emergency room if any of your symptoms worsen or new symptoms occur.   Nonspecific Chest Pain, Adult Chest pain can be caused by many different conditions. It can be caused by a condition that is life-threatening and requires treatment right away. It can also be caused by something that is not life-threatening. If you have chest pain, it can be hard to know the difference, so it is important to get help right away to make sure that you do not have a serious condition. Some life-threatening causes of chest pain include:  Heart attack.  A tear in the body's main blood vessel (aortic dissection).  Inflammation around your heart (pericarditis).  A problem in the lungs, such as a blood clot (pulmonary embolism) or a collapsed lung (pneumothorax). Some non life-threatening causes of chest pain include:  Heartburn.  Anxiety or stress.  Damage to the bones, muscles, and cartilage that make up your chest wall.  Pneumonia or bronchitis.  Shingles infection (varicella-zoster virus). Chest pain can feel like:  Pain or discomfort on the surface of your chest or deep in your chest.  Crushing, pressure, aching, or squeezing pain.  Burning or tingling.  Dull or sharp  pain that is worse when you move, cough, or take a deep breath.  Pain or discomfort that is also felt in your back, neck, jaw, shoulder, or arm, or pain that spreads to any of these areas. Your chest pain may come and go. It may also be constant. Your health care provider will do lab tests and other studies to find the cause of your pain. Treatment will depend on the cause of your chest pain. Follow these instructions at home: Medicines  Take over-the-counter and prescription medicines only as told by your health care provider.  If you were prescribed an antibiotic, take it as told by your health care provider. Do not stop taking the antibiotic even if you start to feel better. Lifestyle   Rest as directed by your health care provider.  Do not use any products that contain nicotine or tobacco, such as cigarettes and e-cigarettes. If you need help quitting, ask your health care provider.  Do not drink alcohol.  Make healthy lifestyle choices as recommended. These may include: ? Getting regular exercise. Ask your health care provider to suggest some activities that are safe for you. ? Eating a heart-healthy diet. This includes plenty of fresh fruits and vegetables, whole grains, low-fat (lean) protein, and low-fat dairy products. A dietitian can help you find healthy eating options. ? Maintaining a healthy weight. ? Managing any other health conditions you have, such as high blood pressure (hypertension) or diabetes. ? Reducing stress, such as with yoga or relaxation techniques. General instructions  Pay attention to any changes in your symptoms. Tell your health care provider about them or any new symptoms.  Avoid any activities that cause chest pain.  Keep all follow-up visits as told by your health care provider. This is important. This includes visits for any further testing if your chest pain does not go away. Contact a health care provider if:  Your chest pain does not go  away.  You feel depressed.  You have a fever. Get help right away if:  Your chest pain gets worse.  You have a cough that gets worse, or you cough up blood.  You have severe pain in your abdomen.  You faint.  You have sudden, unexplained chest discomfort.  You have sudden, unexplained discomfort in your arms, back, neck, or jaw.  You have shortness of breath at any time.  You suddenly start to sweat, or your skin gets clammy.  You feel nausea or you vomit.  You suddenly feel lightheaded or dizzy.  You have severe weakness, or unexplained weakness or fatigue.  Your heart begins to beat quickly, or it feels like it is skipping beats. These symptoms may represent a serious problem that is an emergency. Do not wait to see if the symptoms will go away. Get medical help right away. Call your local emergency services (911 in the [place] Do not drive yourself to the hospital. Summary  Chest pain can be caused by a condition that is serious and requires urgent treatment. It may also be caused by something that is not life-threatening.  If you have chest pain, it is very important to see your health care provider. Your health care provider may do lab tests and other studies to find the cause of your pain.  Follow your health care provider's instructions on taking medicines, making lifestyle changes, and getting emergency treatment if symptoms become worse.  Keep all follow-up visits as told by your health care provider. This includes visits for any further testing if your chest pain does not go away. This information is not intended to replace advice given to you by your health care provider. Make sure you discuss any questions you have with your health care provider. Document Revised: 08/20/2017 Document Reviewed: 08/20/2017 Elsevier Patient Education  2020 [employer].    Managing Your Hypertension Hypertension is commonly called high blood pressure. This is when the force of  your blood pressing against the walls of your arteries is too strong. Arteries are blood vessels that carry blood from your heart throughout your body. Hypertension forces the heart to work harder to pump blood, and may cause the arteries to become narrow or stiff. Having untreated or uncontrolled hypertension can cause heart attack, stroke, kidney disease, and other problems. What are blood pressure readings? A blood pressure reading consists of a higher number over a lower number. Ideally, your blood pressure should be below 120/80. The first ("top") number is called the systolic pressure. It is a measure of the pressure in your arteries as your heart beats. The second ("bottom") number is called the diastolic pressure. It is a measure of the pressure in your arteries as the heart relaxes. What does my blood pressure reading mean? Blood pressure is classified into four stages. Based on your blood pressure reading, your health care provider may use the following stages to determine what type of treatment you need, if any. Systolic pressure and diastolic pressure are measured in a unit called mm Hg. Normal  Systolic pressure: below 120.  Diastolic pressure: below 80. Elevated  Systolic pressure: 120-129.  Diastolic pressure: below 80. Hypertension stage 1  Systolic pressure: 130-139.  Diastolic pressure: 80-89. Hypertension stage 2  Systolic pressure: 140 or above.  Diastolic pressure: 90 or above. What health risks are associated with hypertension? Managing your hypertension is an important responsibility. Uncontrolled hypertension can lead to:  A heart attack.  A stroke.  A weakened blood vessel (aneurysm).  Heart failure.  Kidney damage.  Eye damage.  Metabolic syndrome.  Memory  and concentration problems. What changes can I make to manage my hypertension? Hypertension can be managed by making lifestyle changes and possibly by taking medicines. Your health care provider  will help you make a plan to bring your blood pressure within a normal range. Eating and drinking   Eat a diet that is high in fiber and potassium, and low in salt (sodium), added sugar, and fat. An example eating plan is called the DASH (Dietary Approaches to Stop Hypertension) diet. To eat this way: ? Eat plenty of fresh fruits and vegetables. Try to fill half of your plate at each meal with fruits and vegetables. ? Eat whole grains, such as whole wheat pasta, brown rice, or whole grain bread. Fill about one quarter of your plate with whole grains. ? Eat low-fat diary products. ? Avoid fatty cuts of meat, processed or cured meats, and poultry with skin. Fill about one quarter of your plate with lean proteins such as fish, chicken without skin, beans, eggs, and tofu. ? Avoid premade and processed foods. These tend to be higher in sodium, added sugar, and fat.  Reduce your daily sodium intake. Most people with hypertension should eat less than 1,500 mg of sodium a day.  Limit alcohol intake to no more than 1 drink a day for nonpregnant women and 2 drinks a day for men. One drink equals 12 oz of beer, 5 oz of wine, or 1 oz of hard liquor. Lifestyle  Work with your health care provider to maintain a healthy body weight, or to lose weight. Ask what an ideal weight is for you.  Get at least 30 minutes of exercise that causes your heart to beat faster (aerobic exercise) most days of the week. Activities may include walking, swimming, or biking.  Include exercise to strengthen your muscles (resistance exercise), such as weight lifting, as part of your weekly exercise routine. Try to do these types of exercises for 30 minutes at least 3 days a week.  Do not use any products that contain nicotine or tobacco, such as cigarettes and e-cigarettes. If you need help quitting, ask your health care provider.  Control any long-term (chronic) conditions you have, such as high cholesterol or  diabetes. Monitoring  Monitor your blood pressure at home as told by your health care provider. Your personal target blood pressure may vary depending on your medical conditions, your age, and other factors.  Have your blood pressure checked regularly, as often as told by your health care provider. Working with your health care provider  Review all the medicines you take with your health care provider because there may be side effects or interactions.  Talk with your health care provider about your diet, exercise habits, and other lifestyle factors that may be contributing to hypertension.  Visit your health care provider regularly. Your health care provider can help you create and adjust your plan for managing hypertension. Will I need medicine to control my blood pressure? Your health care provider may prescribe medicine if lifestyle changes are not enough to get your blood pressure under control, and if:  Your systolic blood pressure is 130 or higher.  Your diastolic blood pressure is 80 or higher. Take medicines only as told by your health care provider. Follow the directions carefully. Blood pressure medicines must be taken as prescribed. The medicine does not work as well when you skip doses. Skipping doses also puts you at risk for problems. Contact a health care provider if:  You think you  are having a reaction to medicines you have taken.  You have repeated (recurrent) headaches.  You feel dizzy.  You have swelling in your ankles.  You have trouble with your vision. Get help right away if:  You develop a severe headache or confusion.  You have unusual weakness or numbness, or you feel faint.  You have severe pain in your chest or abdomen.  You vomit repeatedly.  You have trouble breathing. Summary  Hypertension is when the force of blood pumping through your arteries is too strong. If this condition is not controlled, it may put you at risk for serious  complications.  Your personal target blood pressure may vary depending on your medical conditions, your age, and other factors. For most people, a normal blood pressure is less than 120/80.  Hypertension is managed by lifestyle changes, medicines, or both. Lifestyle changes include weight loss, eating a healthy, low-sodium diet, exercising more, and limiting alcohol. This information is not intended to replace advice given to you by your health care provider. Make sure you discuss any questions you have with your health care provider. Document Revised: 06/11/2018 Document Reviewed: 01/16/2016 Elsevier Patient Education  [employer]2020 [employer]     If you have lab work done today you will be contacted with your lab results within the next 2 weeks.  If you have not heard from us then please contact us. The fastest way to get your results is to register for My Chart.   IF you received an x-ray today, you will receive an invoice from [facility]. Please contact [facility] at [phone] with questions or concerns regarding your invoice.   IF you received labwork today, you will receive an invoice from [facility]. Please contact [facility] at [phone] with questions or concerns regarding your invoice.   Our billing staff will not be able to assist you with questions regarding bills from these companies.  You will be contacted with the lab results as soon as they are available. The fastest way to get your results is to activate your My Chart account. Instructions are located on the last page of this paperwork. If you have not heard from us regarding the results in 2 weeks, please contact this office.         Signed, Meredith StaggersJeffrey Correll Denbow, MD [facility]

## 2019-04-04 NOTE — Patient Instructions (Addendum)
I will check some labs with the recent increase in blood pressure.  Additionally follow-up with cardiology to discuss the previous chest pain as well as your family history.  I will check some screening labs today, add amlodipine 5 mg once per day to the losartan HCTZ and keep a record of your home blood pressures.  Can discuss results as well as blood pressure control at follow-up in 2 weeks.  Thank you for coming in today.  Let me know if there are questions.   Return to the clinic or go to the nearest emergency room if any of your symptoms worsen or new symptoms occur.   Nonspecific Chest Pain, Adult Chest pain can be caused by many different conditions. It can be caused by a condition that is life-threatening and requires treatment right away. It can also be caused by something that is not life-threatening. If you have chest pain, it can be hard to know the difference, so it is important to get help right away to make sure that you do not have a serious condition. Some life-threatening causes of chest pain include:  Heart attack.  A tear in the body's main blood vessel (aortic dissection).  Inflammation around your heart (pericarditis).  A problem in the lungs, such as a blood clot (pulmonary embolism) or a collapsed lung (pneumothorax). Some non life-threatening causes of chest pain include:  Heartburn.  Anxiety or stress.  Damage to the bones, muscles, and cartilage that make up your chest wall.  Pneumonia or bronchitis.  Shingles infection (varicella-zoster virus). Chest pain can feel like:  Pain or discomfort on the surface of your chest or deep in your chest.  Crushing, pressure, aching, or squeezing pain.  Burning or tingling.  Dull or sharp pain that is worse when you move, cough, or take a deep breath.  Pain or discomfort that is also felt in your back, neck, jaw, shoulder, or arm, or pain that spreads to any of these areas. Your chest pain may come and go. It may  also be constant. Your health care provider will do lab tests and other studies to find the cause of your pain. Treatment will depend on the cause of your chest pain. Follow these instructions at home: Medicines  Take over-the-counter and prescription medicines only as told by your health care provider.  If you were prescribed an antibiotic, take it as told by your health care provider. Do not stop taking the antibiotic even if you start to feel better. Lifestyle   Rest as directed by your health care provider.  Do not use any products that contain nicotine or tobacco, such as cigarettes and e-cigarettes. If you need help quitting, ask your health care provider.  Do not drink alcohol.  Make healthy lifestyle choices as recommended. These may include: ? Getting regular exercise. Ask your health care provider to suggest some activities that are safe for you. ? Eating a heart-healthy diet. This includes plenty of fresh fruits and vegetables, whole grains, low-fat (lean) protein, and low-fat dairy products. A dietitian can help you find healthy eating options. ? Maintaining a healthy weight. ? Managing any other health conditions you have, such as high blood pressure (hypertension) or diabetes. ? Reducing stress, such as with yoga or relaxation techniques. General instructions  Pay attention to any changes in your symptoms. Tell your health care provider about them or any new symptoms.  Avoid any activities that cause chest pain.  Keep all follow-up visits as told by your  health care provider. This is important. This includes visits for any further testing if your chest pain does not go away. Contact a health care provider if:  Your chest pain does not go away.  You feel depressed.  You have a fever. Get help right away if:  Your chest pain gets worse.  You have a cough that gets worse, or you cough up blood.  You have severe pain in your abdomen.  You faint.  You have  sudden, unexplained chest discomfort.  You have sudden, unexplained discomfort in your arms, back, neck, or jaw.  You have shortness of breath at any time.  You suddenly start to sweat, or your skin gets clammy.  You feel nausea or you vomit.  You suddenly feel lightheaded or dizzy.  You have severe weakness, or unexplained weakness or fatigue.  Your heart begins to beat quickly, or it feels like it is skipping beats. These symptoms may represent a serious problem that is an emergency. Do not wait to see if the symptoms will go away. Get medical help right away. Call your local emergency services (911 in the [place] Do not drive yourself to the hospital. Summary  Chest pain can be caused by a condition that is serious and requires urgent treatment. It may also be caused by something that is not life-threatening.  If you have chest pain, it is very important to see your health care provider. Your health care provider may do lab tests and other studies to find the cause of your pain.  Follow your health care provider's instructions on taking medicines, making lifestyle changes, and getting emergency treatment if symptoms become worse.  Keep all follow-up visits as told by your health care provider. This includes visits for any further testing if your chest pain does not go away. This information is not intended to replace advice given to you by your health care provider. Make sure you discuss any questions you have with your health care provider. Document Revised: 08/20/2017 Document Reviewed: 08/20/2017 Elsevier Patient Education  2020 [employer].    Managing Your Hypertension Hypertension is commonly called high blood pressure. This is when the force of your blood pressing against the walls of your arteries is too strong. Arteries are blood vessels that carry blood from your heart throughout your body. Hypertension forces the heart to work harder to pump blood, and may cause the  arteries to become narrow or stiff. Having untreated or uncontrolled hypertension can cause heart attack, stroke, kidney disease, and other problems. What are blood pressure readings? A blood pressure reading consists of a higher number over a lower number. Ideally, your blood pressure should be below 120/80. The first ("top") number is called the systolic pressure. It is a measure of the pressure in your arteries as your heart beats. The second ("bottom") number is called the diastolic pressure. It is a measure of the pressure in your arteries as the heart relaxes. What does my blood pressure reading mean? Blood pressure is classified into four stages. Based on your blood pressure reading, your health care provider may use the following stages to determine what type of treatment you need, if any. Systolic pressure and diastolic pressure are measured in a unit called mm Hg. Normal  Systolic pressure: below 120.  Diastolic pressure: below 80. Elevated  Systolic pressure: 120-129.  Diastolic pressure: below 80. Hypertension stage 1  Systolic pressure: 130-139.  Diastolic pressure: 80-89. Hypertension stage 2  Systolic pressure: 140 or above.  Diastolic pressure: 90 or above. What health risks are associated with hypertension? Managing your hypertension is an important responsibility. Uncontrolled hypertension can lead to:  A heart attack.  A stroke.  A weakened blood vessel (aneurysm).  Heart failure.  Kidney damage.  Eye damage.  Metabolic syndrome.  Memory and concentration problems. What changes can I make to manage my hypertension? Hypertension can be managed by making lifestyle changes and possibly by taking medicines. Your health care provider will help you make a plan to bring your blood pressure within a normal range. Eating and drinking   Eat a diet that is high in fiber and potassium, and low in salt (sodium), added sugar, and fat. An example eating plan is  called the DASH (Dietary Approaches to Stop Hypertension) diet. To eat this way: ? Eat plenty of fresh fruits and vegetables. Try to fill half of your plate at each meal with fruits and vegetables. ? Eat whole grains, such as whole wheat pasta, brown rice, or whole grain bread. Fill about one quarter of your plate with whole grains. ? Eat low-fat diary products. ? Avoid fatty cuts of meat, processed or cured meats, and poultry with skin. Fill about one quarter of your plate with lean proteins such as fish, chicken without skin, beans, eggs, and tofu. ? Avoid premade and processed foods. These tend to be higher in sodium, added sugar, and fat.  Reduce your daily sodium intake. Most people with hypertension should eat less than 1,500 mg of sodium a day.  Limit alcohol intake to no more than 1 drink a day for nonpregnant women and 2 drinks a day for men. One drink equals 12 oz of beer, 5 oz of wine, or 1 oz of hard liquor. Lifestyle  Work with your health care provider to maintain a healthy body weight, or to lose weight. Ask what an ideal weight is for you.  Get at least 30 minutes of exercise that causes your heart to beat faster (aerobic exercise) most days of the week. Activities may include walking, swimming, or biking.  Include exercise to strengthen your muscles (resistance exercise), such as weight lifting, as part of your weekly exercise routine. Try to do these types of exercises for 30 minutes at least 3 days a week.  Do not use any products that contain nicotine or tobacco, such as cigarettes and e-cigarettes. If you need help quitting, ask your health care provider.  Control any long-term (chronic) conditions you have, such as high cholesterol or diabetes. Monitoring  Monitor your blood pressure at home as told by your health care provider. Your personal target blood pressure may vary depending on your medical conditions, your age, and other factors.  Have your blood pressure  checked regularly, as often as told by your health care provider. Working with your health care provider  Review all the medicines you take with your health care provider because there may be side effects or interactions.  Talk with your health care provider about your diet, exercise habits, and other lifestyle factors that may be contributing to hypertension.  Visit your health care provider regularly. Your health care provider can help you create and adjust your plan for managing hypertension. Will I need medicine to control my blood pressure? Your health care provider may prescribe medicine if lifestyle changes are not enough to get your blood pressure under control, and if:  Your systolic blood pressure is 130 or higher.  Your diastolic blood pressure is 80 or higher. Take  medicines only as told by your health care provider. Follow the directions carefully. Blood pressure medicines must be taken as prescribed. The medicine does not work as well when you skip doses. Skipping doses also puts you at risk for problems. Contact a health care provider if:  You think you are having a reaction to medicines you have taken.  You have repeated (recurrent) headaches.  You feel dizzy.  You have swelling in your ankles.  You have trouble with your vision. Get help right away if:  You develop a severe headache or confusion.  You have unusual weakness or numbness, or you feel faint.  You have severe pain in your chest or abdomen.  You vomit repeatedly.  You have trouble breathing. Summary  Hypertension is when the force of blood pumping through your arteries is too strong. If this condition is not controlled, it may put you at risk for serious complications.  Your personal target blood pressure may vary depending on your medical conditions, your age, and other factors. For most people, a normal blood pressure is less than 120/80.  Hypertension is managed by lifestyle changes,  medicines, or both. Lifestyle changes include weight loss, eating a healthy, low-sodium diet, exercising more, and limiting alcohol. This information is not intended to replace advice given to you by your health care provider. Make sure you discuss any questions you have with your health care provider. Document Revised: 06/11/2018 Document Reviewed: 01/16/2016 Elsevier Patient Education  [employer].     If you have lab work done today you will be contacted with your lab results within the next 2 weeks.  If you have not heard from [place] then please contact us. The fastest way to get your results is to register for My Chart.   IF you received an x-ray today, you will receive an invoice from [facility]. Please contact [facility] at [phone] with questions or concerns regarding your invoice.   IF you received labwork today, you will receive an invoice from [facility]. Please contact [facility] at [phone] with questions or concerns regarding your invoice.   Our billing staff will not be able to assist you with questions regarding bills from these companies.  You will be contacted with the lab results as soon as they are available. The fastest way to get your results is to activate your My Chart account. Instructions are located on the last page of this paperwork. If you have not heard from [place] regarding the results in 2 weeks, please contact this office.

## 2019-04-05 ENCOUNTER — Encounter: Payer: Self-pay | Admitting: Radiology

## 2019-04-05 LAB — CBC
Hematocrit: 43.9 % (ref 37.5–51.0)
Hemoglobin: 15.3 g/dL (ref 13.0–17.7)
MCH: 29.5 pg (ref 26.6–33.0)
MCHC: 34.9 g/dL (ref 31.5–35.7)
MCV: 85 fL (ref 79–97)
Platelets: 388 10*3/uL (ref 150–450)
RBC: 5.19 x10E6/uL (ref 4.14–5.80)
RDW: 13.4 % (ref 11.6–15.4)
WBC: 11.3 10*3/uL — ABNORMAL HIGH (ref 3.4–10.8)

## 2019-04-05 LAB — TSH: TSH: 2.81 u[IU]/mL (ref 0.450–4.500)

## 2019-04-05 LAB — COMPREHENSIVE METABOLIC PANEL
ALT: 30 IU/L (ref 0–44)
AST: 18 IU/L (ref 0–40)
Albumin/Globulin Ratio: 1.7 (ref 1.2–2.2)
Albumin: 4.6 g/dL (ref 4.0–5.0)
Alkaline Phosphatase: 94 IU/L (ref 39–117)
BUN/Creatinine Ratio: 15 (ref 9–20)
BUN: 13 mg/dL (ref 6–20)
Bilirubin Total: 0.3 mg/dL (ref 0.0–1.2)
CO2: 25 mmol/L (ref 20–29)
Calcium: 9.6 mg/dL (ref 8.7–10.2)
Chloride: 101 mmol/L (ref 96–106)
Creatinine, Ser: 0.85 mg/dL (ref 0.76–1.27)
GFR calc Af Amer: 131 mL/min/{1.73_m2} (ref >59)
GFR calc non Af Amer: 113 mL/min/{1.73_m2} (ref >59)
Globulin, Total: 2.7 g/dL (ref 1.5–4.5)
Glucose: 78 mg/dL (ref 65–99)
Potassium: 4.3 mmol/L (ref 3.5–5.2)
Sodium: 140 mmol/L (ref 134–144)
Total Protein: 7.3 g/dL (ref 6.0–8.5)

## 2019-04-05 LAB — LIPID PANEL
Chol/HDL Ratio: 4.6 ratio (ref 0.0–5.0)
Cholesterol, Total: 218 mg/dL — ABNORMAL HIGH (ref 100–199)
HDL: 47 mg/dL (ref >39)
LDL Chol Calc (NIH): 124 mg/dL — ABNORMAL HIGH (ref 0–99)
Triglycerides: 268 mg/dL — ABNORMAL HIGH (ref 0–149)
VLDL Cholesterol Cal: 47 mg/dL — ABNORMAL HIGH (ref 5–40)

## 2019-04-15 ENCOUNTER — Encounter: Payer: Self-pay | Admitting: General Practice

## 2019-04-20 ENCOUNTER — Other Ambulatory Visit: Payer: Self-pay

## 2019-04-20 ENCOUNTER — Encounter: Payer: Self-pay | Admitting: Family Medicine

## 2019-04-20 ENCOUNTER — Ambulatory Visit: Payer: 59 | Admitting: Family Medicine

## 2019-04-20 VITALS — BP 138/88 | HR 84 | Temp 98.7°F | Ht 71.0 in | Wt 285.0 lb

## 2019-04-20 DIAGNOSIS — I10 Essential (primary) hypertension: Secondary | ICD-10-CM

## 2019-04-20 DIAGNOSIS — E785 Hyperlipidemia, unspecified: Secondary | ICD-10-CM

## 2019-04-20 DIAGNOSIS — R0683 Snoring: Secondary | ICD-10-CM | POA: Diagnosis not present

## 2019-04-20 DIAGNOSIS — R400 Somnolence: Secondary | ICD-10-CM

## 2019-04-20 DIAGNOSIS — D72829 Elevated white blood cell count, unspecified: Secondary | ICD-10-CM

## 2019-04-20 DIAGNOSIS — E669 Obesity, unspecified: Secondary | ICD-10-CM

## 2019-04-20 DIAGNOSIS — Z6839 Body mass index (BMI) 39.0-39.9, adult: Secondary | ICD-10-CM

## 2019-04-20 LAB — CBC
Hematocrit: 45.8 % (ref 37.5–51.0)
Hemoglobin: 15.4 g/dL (ref 13.0–17.7)
MCH: 28.8 pg (ref 26.6–33.0)
MCHC: 33.6 g/dL (ref 31.5–35.7)
MCV: 86 fL (ref 79–97)
Platelets: 391 10*3/uL (ref 150–450)
RBC: 5.35 x10E6/uL (ref 4.14–5.80)
RDW: 14 % (ref 11.6–15.4)
WBC: 9.9 10*3/uL (ref 3.4–10.8)

## 2019-04-20 NOTE — Patient Instructions (Addendum)
  It appears cardiology has been trying to call you to schedule appointment.  Here is their information to call and schedule if needed. [facility] Phone: [phone].   I will refer you to sleep specialist to discuss possible sleep apnea.   I will repeat blood counts today, but if you have any fevers or other infectious symptoms, be seen right away.  No med changes for now, but if ankle pains persist, we can try a different medication in the next few weeks.  Follow-up sooner if those areas are worsening.  Thank you for coming in today and let me know if there are questions.   If you have lab work done today you will be contacted with your lab results within the next 2 weeks.  If you have not heard from [place] then please contact us. The fastest way to get your results is to register for My Chart.   IF you received an x-ray today, you will receive an invoice from [facility]. Please contact [facility] at [phone] with questions or concerns regarding your invoice.   IF you received labwork today, you will receive an invoice from [facility]. Please contact [facility] at [phone] with questions or concerns regarding your invoice.   Our billing staff will not be able to assist you with questions regarding bills from these companies.  You will be contacted with the lab results as soon as they are available. The fastest way to get your results is to activate your My Chart account. Instructions are located on the last page of this paperwork. If you have not heard from [place] regarding the results in 2 weeks, please contact this office.

## 2019-04-20 NOTE — Progress Notes (Signed)
Subjective:  Patient ID: Ricky Velez, male    DOB: 1985-10-29  Age: 34 y.o. MRN: [id number]  CC:  Chief Complaint  Patient presents with  . Follow-up    on hypertension, pt feels okay but has had two episodes of shortness of breath lasting 2-3 min and pt wife has told him he is now snoring more since starting medication, denies headache or diziness, does report feeling more tired and has aching /pain in ankles starting aproximatly 3 days after starting the new medication.    HPI Ricky Velez presents for   Hypertension: Follow-up from establish care visit on February 1.  Previously had been evaluated by urgent care on January 24, headache and chest pain that morning, blood pressure elevated in the 170/112 range, then 163/98 in urgent care.  Family history of heart disease with mom with coronary disease in her 37s.  Cardiology referral has been placed.  Had remained on same dose of losartan HCTZ 100/25 mg daily for the previous 2 years.  Added amlodipine 5 mg daily.  Since last visit does note 2 episodes of dyspnea.  Watching TV - felt like could not get his breath. No associated cough or chest pain. Not feeling anxious. Resolved in few mins. No recent sob with exertion.  Other episode - snoring, she noticed him stopping or abnormal breathing.  Slight HA when he woke. No regular headaches.  History of snoring - more lately possible per wife.  Has had some increased fatigue, occasional aching and pain in ankles a few days after starting amlodipine. Not limiting activity - just occasional. Some daytime sleepiness.   Referral notes reviewed, it appears multiple calls have been placed the patient to schedule appointment, [facility] [address].   Weight has increased 20-50 # past year. No regular exercise. Fast food - rare, does eat a lot of rice and breads. No regular soda, no sweet tea, no regular alcohol.   Home readings: 130-140/80-90.   BP Readings from Last 3  Encounters:  04/20/19 138/88  04/04/19 (!) 148/102  03/27/19 (!) 163/98   Lab Results  Component Value Date   CREATININE 0.85 04/04/2019   Hyperlipidemia: No current medications. Plan for diet/weight loss.   Lab Results  Component Value Date   CHOL 218 (H) 04/04/2019   HDL 47 04/04/2019   LDLCALC 124 (H) 04/04/2019   TRIG 268 (H) 04/04/2019   CHOLHDL 4.6 04/04/2019   Lab Results  Component Value Date   ALT 30 04/04/2019   AST 18 04/04/2019   ALKPHOS 94 04/04/2019   BILITOT 0.3 04/04/2019    Leukocytosis: No fever, no recent infections or infectious symptoms. No cough, no dysuria, no rash/wounds. No abd pains.  Lab Results  Component Value Date   WBC 11.3 (H) 04/04/2019   HGB 15.3 04/04/2019   HCT 43.9 04/04/2019   MCV 85 04/04/2019   PLT 388 04/04/2019    History Patient Active Problem List   Diagnosis Date Noted  . Essential hypertension 04/17/2017  . Proteinuria 04/17/2017   Past Medical History:  Diagnosis Date  . Asthma   . Hypertension    No past surgical history on file. No Known Allergies Prior to Admission medications   Medication Sig Start Date End Date Taking? Authorizing Provider  amLODipine (NORVASC) 5 MG tablet Take 1 tablet (5 mg total) by mouth daily. 04/04/19  Yes Shade Flood, MD  losartan-hydrochlorothiazide (HYZAAR) 100-25 MG tablet Take 1 tablet by mouth daily. 04/21/17  Yes Massie Maroon, FNP   Social History   Socioeconomic History  . Marital status: Married    Spouse name: Not on file  . Number of children: Not on file  . Years of education: Not on file  . Highest education level: Not on file  Occupational History  . Not on file  Tobacco Use  . Smoking status: Never Smoker  . Smokeless tobacco: Never Used  Substance and Sexual Activity  . Alcohol use: Yes  . Drug use: No  . Sexual activity: Yes  Other Topics Concern  . Not on file  Social History Narrative  . Not on file   Social Determinants of Health    Financial Resource Strain:   . Difficulty of Paying Living Expenses: Not on file  Food Insecurity:   . Worried About Programme researcher, broadcasting/film/video in the Last Year: Not on file  . Ran Out of Food in the Last Year: Not on file  Transportation Needs:   . Lack of Transportation (Medical): Not on file  . Lack of Transportation (Non-Medical): Not on file  Physical Activity:   . Days of Exercise per Week: Not on file  . Minutes of Exercise per Session: Not on file  Stress:   . Feeling of Stress : Not on file  Social Connections:   . Frequency of Communication with Friends and Family: Not on file  . Frequency of Social Gatherings with Friends and Family: Not on file  . Attends Religious Services: Not on file  . Active Member of Clubs or Organizations: Not on file  . Attends Banker Meetings: Not on file  . Marital Status: Not on file  Intimate Partner Violence:   . Fear of Current or Ex-Partner: Not on file  . Emotionally Abused: Not on file  . Physically Abused: Not on file  . Sexually Abused: Not on file    Review of Systems  Constitutional: Negative for fatigue and unexpected weight change.  Eyes: Negative for visual disturbance.  Respiratory: Negative for cough, chest tightness and shortness of breath.   Cardiovascular: Negative for chest pain, palpitations and leg swelling.  Gastrointestinal: Negative for abdominal pain and blood in stool.  Neurological: Negative for dizziness, light-headedness and headaches.     Objective:   Vitals:   04/20/19 1006 04/20/19 1013  BP: (!) 150/96 138/88  Pulse: 84   Temp: 98.7 F (37.1 C)   TempSrc: Temporal   SpO2: 96%   Weight: 285 lb (129.3 kg)   Height: 5\' 11"  (1.803 m)      Physical Exam Vitals reviewed.  Constitutional:      Appearance: He is well-developed.  HENT:     Head: Normocephalic and atraumatic.  Eyes:     Pupils: Pupils are equal, round, and reactive to light.  Neck:     Vascular: No carotid bruit or  JVD.  Cardiovascular:     Rate and Rhythm: Normal rate and regular rhythm.     Heart sounds: Normal heart sounds. No murmur.  Pulmonary:     Effort: Pulmonary effort is normal.     Breath sounds: Normal breath sounds. No rales.  Musculoskeletal:     Right lower leg: No edema.     Left lower leg: No edema.     Comments: Slight ttp medial malleoli of ankles bilaterally. No edema  Skin:    General: Skin is warm and dry.  Neurological:     Mental Status: He is alert and oriented  to person, place, and time.     Assessment & Plan:  Arliss Frisina Velez is a 34 y.o. male . Essential hypertension  -Improved control.  Decided to remain on same regimen for now.  Less likely amlodipine causing arthralgias of ankles, but if persistent ankle pains, could try other agent.  Follow-up next few weeks if not improving.  Snoring - Plan: Ambulatory referral to Sleep Studies Daytime somnolence - Plan: Ambulatory referral to Sleep Studies Class 2 obesity with body mass index (BMI) of 39.0 to 39.9 in adult, unspecified obesity type, unspecified whether serious comorbidity present  -Noticed increased snoring recently, some daytime somnolence along with obesity/weight gain.  Suspicious for obstructive sleep apnea, referred to sleep specialist.  Advised to not drive or operate machinery while sedated.  Leukocytosis, unspecified type - Plan: CBC  -Asymptomatic, repeat CBC, RTC precautions  Hyperlipidemia, unspecified hyperlipidemia type  -Mild elevation, plan for diet change, weight loss initially with repeat testing in the next 6 months.  No orders of the defined types were placed in this encounter.  Patient Instructions    It appears cardiology has been trying to call you to schedule appointment.  Here is their information to call and schedule if needed. [place] at [facility] Phone: [phone].   I will refer you to sleep specialist to discuss possible sleep  apnea.   I will repeat blood counts today, but if you have any fevers or other infectious symptoms, be seen right away.  No med changes for now, but if ankle pains persist, we can try a different medication in the next few weeks.  Follow-up sooner if those areas are worsening.  Thank you for coming in today and let me know if there are questions.   If you have lab work done today you will be contacted with your lab results within the next 2 weeks.  If you have not heard from [place] then please contact us. The fastest way to get your results is to register for My Chart.   IF you received an x-ray today, you will receive an invoice from [facility]. Please contact [facility] at [phone] with questions or concerns regarding your invoice.   IF you received labwork today, you will receive an invoice from [facility]. Please contact [facility] at [phone] with questions or concerns regarding your invoice.   Our billing staff will not be able to assist you with questions regarding bills from these companies.  You will be contacted with the lab results as soon as they are available. The fastest way to get your results is to activate your My Chart account. Instructions are located on the last page of this paperwork. If you have not heard from [place] regarding the results in 2 weeks, please contact this office.         Signed, Merri Ray, MD [facility]

## 2019-04-28 ENCOUNTER — Emergency Department (HOSPITAL_COMMUNITY)
Admission: EM | Admit: 2019-04-28 | Discharge: 2019-04-28 | Disposition: A | Discharge status: Home / Self Care | Payer: 59 | Attending: Emergency Medicine | Admitting: Emergency Medicine

## 2019-04-28 ENCOUNTER — Encounter (HOSPITAL_COMMUNITY): Payer: Self-pay | Admitting: Emergency Medicine

## 2019-04-28 ENCOUNTER — Other Ambulatory Visit: Payer: Self-pay

## 2019-04-28 ENCOUNTER — Emergency Department (HOSPITAL_COMMUNITY): Payer: 59

## 2019-04-28 DIAGNOSIS — R0789 Other chest pain: Secondary | ICD-10-CM | POA: Diagnosis not present

## 2019-04-28 DIAGNOSIS — I10 Essential (primary) hypertension: Secondary | ICD-10-CM

## 2019-04-28 DIAGNOSIS — J029 Acute pharyngitis, unspecified: Secondary | ICD-10-CM | POA: Insufficient documentation

## 2019-04-28 DIAGNOSIS — Z79899 Other long term (current) drug therapy: Secondary | ICD-10-CM | POA: Diagnosis not present

## 2019-04-28 LAB — COMPREHENSIVE METABOLIC PANEL
ALT: 28 U/L (ref 0–44)
AST: 25 U/L (ref 15–41)
Albumin: 4.4 g/dL (ref 3.5–5.0)
Alkaline Phosphatase: 76 U/L (ref 38–126)
Anion gap: 11 (ref 5–15)
BUN: 13 mg/dL (ref 6–20)
CO2: 26 mmol/L (ref 22–32)
Calcium: 9.5 mg/dL (ref 8.9–10.3)
Chloride: 103 mmol/L (ref 98–111)
Creatinine, Ser: 0.92 mg/dL (ref 0.61–1.24)
GFR calc Af Amer: >60 mL/min (ref >60)
GFR calc non Af Amer: >60 mL/min (ref >60)
Glucose, Bld: 85 mg/dL (ref 70–99)
Potassium: 3.7 mmol/L (ref 3.5–5.1)
Sodium: 140 mmol/L (ref 135–145)
Total Bilirubin: 1.1 mg/dL (ref 0.3–1.2)
Total Protein: 8.1 g/dL (ref 6.5–8.1)

## 2019-04-28 LAB — CBC WITH DIFFERENTIAL/PLATELET
Abs Immature Granulocytes: 0.03 10*3/uL (ref 0.00–0.07)
Basophils Absolute: 0.1 10*3/uL (ref 0.0–0.1)
Basophils Relative: 1 %
Eosinophils Absolute: 0.1 10*3/uL (ref 0.0–0.5)
Eosinophils Relative: 1 %
HCT: 48.3 % (ref 39.0–52.0)
Hemoglobin: 16 g/dL (ref 13.0–17.0)
Immature Granulocytes: 0 %
Lymphocytes Relative: 24 %
Lymphs Abs: 2.6 10*3/uL (ref 0.7–4.0)
MCH: 28.8 pg (ref 26.0–34.0)
MCHC: 33.1 g/dL (ref 30.0–36.0)
MCV: 86.9 fL (ref 80.0–100.0)
Monocytes Absolute: 0.7 10*3/uL (ref 0.1–1.0)
Monocytes Relative: 6 %
Neutro Abs: 7.1 10*3/uL (ref 1.7–7.7)
Neutrophils Relative %: 68 %
Platelets: 403 10*3/uL — ABNORMAL HIGH (ref 150–400)
RBC: 5.56 MIL/uL (ref 4.22–5.81)
RDW: 13.4 % (ref 11.5–15.5)
WBC: 10.5 10*3/uL (ref 4.0–10.5)
nRBC: 0 % (ref 0.0–0.2)

## 2019-04-28 LAB — TROPONIN I (HIGH SENSITIVITY)
Troponin I (High Sensitivity): 4 ng/L (ref <18)
Troponin I (High Sensitivity): 3 ng/L (ref <18)

## 2019-04-28 LAB — TSH: TSH: 2.055 u[IU]/mL (ref 0.350–4.500)

## 2019-04-28 MED ORDER — ALUM & MAG HYDROXIDE-SIMETH 200-200-20 MG/5ML PO SUSP
30.0000 mL | Freq: Once | ORAL | Status: AC
  Administered 2019-04-28: 13:00:00 30 mL via ORAL
  Filled 2019-04-28: qty 30

## 2019-04-28 MED ORDER — LIDOCAINE VISCOUS HCL 2 % MT SOLN
15.0000 mL | Freq: Once | OROMUCOSAL | Status: AC
  Administered 2019-04-28: 15 mL via ORAL
  Filled 2019-04-28: qty 15

## 2019-04-28 NOTE — Discharge Instructions (Addendum)
Continue taking home medications as prescribed. Follow-up with your cardiologist as scheduled. Follow-up with primary care doctor as needed for further evaluation of your blood pressure. Return to the emergency room if develop severe worsening chest pain, difficulty breathing, any new, worsening, or concerning symptoms.

## 2019-04-28 NOTE — ED Provider Notes (Signed)
[facility] EMERGENCY DEPARTMENT Provider Note   CSN: [id number] Arrival date & time:        History Chief Complaint  Patient presents with  . Sore Throat  . Hypertension    [place] is a 34 y.o. male clinic for evaluation of chest discomfort and high blood pressure.  Patient states about 1 hour prior to arrival he developed some discomfort in his upper chest/lower throat.  He states he does not like a normal sore throat.  He is also having pain going to both shoulders/upper back.  He checked his blood pressure, it was found to be high around 315 systolic.  He states he is a history of high blood pressure, his medications were changed 3 weeks ago and he has had good control on this since until today.  He denies chest pain or shortness of breath.  He denies increased pain with respiration or activity.  He has not had anything for symptoms including Tylenol ibuprofen.  He denies recent fall, trauma, or injury.  Denies fevers, chills, cough, nausea, vomiting, abd pain, urinary symptoms, abnormal bowel movements.  He has an appointment with cardiology scheduled soon.   HPI     Past Medical History:  Diagnosis Date  . Asthma   . Hypertension     Patient Active Problem List   Diagnosis Date Noted  . Essential hypertension 04/17/2017  . Proteinuria 04/17/2017    History reviewed. No pertinent surgical history.     Family History  Problem Relation Age of Onset  . Hypertension Mother   . Diabetes Father   . Hypertension Father   . Diabetes Maternal Grandmother   . Hypertension Maternal Grandfather   . Diabetes Paternal Grandmother   . Hypertension Paternal Grandfather     Social History   Tobacco Use  . Smoking status: Never Smoker  . Smokeless tobacco: Never Used  Substance Use Topics  . Alcohol use: Yes  . Drug use: No    Home Medications Prior to Admission medications   Medication Sig Start Date End Date Taking? Authorizing  Provider  amLODipine (NORVASC) 5 MG tablet Take 1 tablet (5 mg total) by mouth daily. 04/04/19   Wendie Agreste, MD  losartan-hydrochlorothiazide (HYZAAR) 100-25 MG tablet Take 1 tablet by mouth daily. 04/21/17   Dorena Dew, FNP    Allergies    Patient has no known allergies.  Review of Systems   Review of Systems  Respiratory: Positive for chest tightness.   Musculoskeletal: Positive for arthralgias and back pain.  All other systems reviewed and are negative.   Physical Exam Updated Vital Signs BP 127/89   Pulse 89   Temp 98.9 F (37.2 C) (Oral)   Resp 15   SpO2 95%   Physical Exam Vitals and nursing note reviewed.  Constitutional:      General: He is not in acute distress.    Appearance: He is well-developed.  HENT:     Head: Normocephalic and atraumatic.     Comments: OP clear her tonsillar swelling exudate.  Uvula with equal palate rise.  TMs nonerythematous nonbulging bilaterally. Eyes:     Extraocular Movements: Extraocular movements intact.     Conjunctiva/sclera: Conjunctivae normal.     Pupils: Pupils are equal, round, and reactive to light.  Neck:     Comments: No cervical LAD Cardiovascular:     Rate and Rhythm: Normal rate and regular rhythm.     Pulses: Normal pulses.  Pulmonary:  Effort: Pulmonary effort is normal. No respiratory distress.     Breath sounds: Normal breath sounds. No wheezing.     Comments: Speaking in full sentences.  Clear lung sounds in all fields. Abdominal:     General: There is no distension.     Palpations: Abdomen is soft. There is no mass.     Tenderness: There is no abdominal tenderness. There is no guarding or rebound.  Musculoskeletal:        General: Normal range of motion.     Cervical back: Normal range of motion and neck supple.  Lymphadenopathy:     Cervical: No cervical adenopathy.  Skin:    General: Skin is warm and dry.     Capillary Refill: Capillary refill takes less than 2 seconds.  Neurological:       Mental Status: He is alert and oriented to person, place, and time.     ED Results / Procedures / Treatments   Labs (all labs ordered are listed, but only abnormal results are displayed) Labs Reviewed  CBC WITH DIFFERENTIAL/PLATELET - Abnormal; Notable for the following components:      Result Value   Platelets 403 (*)    All other components within normal limits  COMPREHENSIVE METABOLIC PANEL  TSH  TROPONIN I (HIGH SENSITIVITY)  TROPONIN I (HIGH SENSITIVITY)    EKG EKG Interpretation  Date/Time:  Thursday April 28 2019 09:44:26 EST Ventricular Rate:  88 PR Interval:    QRS Duration: 110 QT Interval:  361 QTC Calculation: 437 R Axis:   -4 Text Interpretation: Sinus rhythm RSR' in V1 or V2, right VCD or RVH ST elev, probable normal early repol pattern since last tracing no significant change Confirmed by Eber Hong ([id number]) on 04/28/2019 9:46:25 AM   Radiology DG Chest 2 View  Result Date: 04/28/2019 CLINICAL DATA:  Chest pain.  Hypertension EXAM: CHEST - 2 VIEW COMPARISON:  None. FINDINGS: The heart size and mediastinal contours are within normal limits. Both lungs are clear. The visualized skeletal structures are unremarkable. IMPRESSION: No active cardiopulmonary disease. Electronically Signed   By: Marnee Spring M.D.   On: 04/28/2019 10:11    Procedures Procedures (including critical care time)  Medications Ordered in ED Medications  alum & mag hydroxide-simeth (MAALOX/MYLANTA) 200-200-20 MG/5ML suspension 30 mL (30 mLs Oral Given 04/28/19 1319)    And  lidocaine (XYLOCAINE) 2 % viscous mouth solution 15 mL (15 mLs Oral Given 04/28/19 1319)    ED Course  I have reviewed the triage vital signs and the nursing notes.  Pertinent labs & imaging results that were available during my care of the patient were reviewed by me and considered in my medical decision making (see chart for details).    MDM Rules/Calculators/A&P                      Patient  presenting for evaluation of lower throat/upper chest pressure and back pain.  Additionally, he was concerned about high blood pressure.  On exam, patient appears nontoxic.  Blood pressure is improved in the ER, although still mildly elevated.  Will obtain cardiac work-up including labs, EKG, chest x-ray.  Will obtain thyroid.  Initial round of blood work interpreted by me, overall reassuring.  Troponin negative.  No leukocytosis.  Electrolytes stable.  Hemoglobin stable.  TSH normal.  Chest x-ray viewed interpreted by me, no pneumonia pneumothorax or effusion, cardiomegaly.  EKG unchanged from previous.  Blood pressure continues to improve without intervention.  Repeat troponin negative again.  Discussed findings with patient.  Discussed that at this time, low suspicion for emergent ulcer symptoms or concerning cardiac damage.  Discussed follow-up with his cardiologist as scheduled and primary care doctor as needed for further evaluation of blood pressure.  At this time, patient appears safe for discharge.  Return precautions given.  Patient states he understands and agrees to plan.  Final Clinical Impression(s) / ED Diagnoses Final diagnoses:  Hypertension, unspecified type  Chest discomfort    Rx / DC Orders ED Discharge Orders    None       Alveria Apley, PA-C 04/28/19 1618    Eber Hong, MD 04/29/19 2055

## 2019-04-28 NOTE — ED Notes (Signed)
Patient given discharge instructions patient verbalizes understanding. 

## 2019-04-28 NOTE — ED Triage Notes (Signed)
Pt here with sore throat that started 1 hr ago and hbp. Also co Shoulder pressure that started 40 mins ago. Pt takes amlodipine for htn.

## 2019-07-01 ENCOUNTER — Encounter: Payer: Self-pay | Admitting: Internal Medicine

## 2019-07-01 ENCOUNTER — Other Ambulatory Visit: Payer: Self-pay

## 2019-07-01 ENCOUNTER — Ambulatory Visit: Payer: 59 | Admitting: Internal Medicine

## 2019-07-01 VITALS — BP 152/100 | HR 94 | Ht 71.0 in | Wt 286.0 lb

## 2019-07-01 DIAGNOSIS — R0600 Dyspnea, unspecified: Secondary | ICD-10-CM

## 2019-07-01 DIAGNOSIS — R079 Chest pain, unspecified: Secondary | ICD-10-CM | POA: Diagnosis not present

## 2019-07-01 DIAGNOSIS — Z8616 Personal history of COVID-19: Secondary | ICD-10-CM

## 2019-07-01 DIAGNOSIS — I10 Essential (primary) hypertension: Secondary | ICD-10-CM | POA: Diagnosis not present

## 2019-07-01 DIAGNOSIS — R0609 Other forms of dyspnea: Secondary | ICD-10-CM

## 2019-07-01 MED ORDER — LOSARTAN POTASSIUM 25 MG PO TABS: 25.0000 mg | ORAL_TABLET | Freq: Every day | ORAL | 3 refills | Status: DC

## 2019-07-01 NOTE — Patient Instructions (Signed)
Medication Instructions:  Start Losartan 25 mg  One tablet daily   *If you need a refill on your cardiac medications before your next appointment, please call your pharmacy*   Lab Work:  C-rea prot. Sed rate  Trop-I HgbA1c  If you have labs (blood work) drawn today and your tests are completely normal, you will receive your results only by: Marland Kitchen MyChart Message (if you have MyChart) OR . A paper copy in the mail If you have any lab test that is abnormal or we need to change your treatment, we will call you to review the results.   Testing/Procedures: BOTH TEST WILL BE SCHEDULE AT [address] Your physician has requested that you have an echocardiogram. Echocardiography is a painless test that uses sound waves to create images of your heart. It provides your doctor with information about the size and shape of your heart and how well your heart's chambers and valves are working. This procedure takes approximately one hour. There are no restrictions for this procedure. AND Your physician has requested that you have a lexiscan myoview. Please follow instruction sheet, as given.   WILL BE SCHEDULE AT [address]. Your physician has requested that you have a renal artery duplex. During this test, an ultrasound is used to evaluate blood flow to the kidneys. Allow one hour for this exam. Do not eat after midnight the day before and avoid carbonated beverages. Take your medications as you usually do.   Follow-Up: At [facility], you and your health needs are our priority.  As part of our continuing mission to provide you with exceptional heart care, we have created designated Provider Care Teams.  These Care Teams include your primary Cardiologist (physician) and Advanced Practice Providers (APPs -  Physician Assistants and Nurse Practitioners) who all work together to provide you with the care you need, when you need it.  We recommend signing up for the  patient portal called "MyChart".  Sign up information is provided on this After Visit Summary.  MyChart is used to connect with patients for Virtual Visits (Telemedicine).  Patients are able to view lab/test results, encounter notes, upcoming appointments, etc.  Non-urgent messages can be sent to your provider as well.   To learn more about what you can do with MyChart, go to [email or web address].    Your next appointment:    6 to 8 week(s)  The format for your next appointment:   In Person  Provider:      Other Instructions n/a

## 2019-07-01 NOTE — Progress Notes (Signed)
Cardiology Office Note:    Date:  07/01/2019   ID:  Ricky Velez, DOB [birth year], MRN [id number]  PCP:  Ricky Flood, MD  Cardiologist:  No primary care provider on file.  Electrophysiologist:  None   Referring MD: Ricky Flood, MD   Chief Complaint: chest pain, SOB, post covid, HTN  History of Present Illness:    Ricky Velez is a 34 y.o. male with a history of hypertension since the age of 68 as well as asthma who presents today for evaluation of chest pain, shortness of breath, recent Covid infection in January, and worsening hypertension.  He works for [employer].  Approximately 1 month ago he did not feel well, and he took his blood pressure and it was 180/110 while he was at home.  He took some aspirin, however was concerned and went to urgent care where he feels this testing was reported as normal.  He had another episode at work several weeks ago and had his partner check his blood pressure and it was 210/140.  At that time he had no chest pain, headache, no blurry vision, but did feel a tightness in his throat, and had bright spots in his vision.  He had COVID-19 infection in January and since then has had difficulty controlling his blood pressure.  In addition he has had significant shortness of breath at home even resting or lying down.  He has had hypertension since the age of 56.  He believes he took candesartan in the past, and more recently has been on losartan with excellent blood pressure control.  However during his COVID-19 infection he tells me that his losartan was stopped due to concerns of interaction with COVID-19.  He was transition to amlodipine.  He is only on 5 mg a day.  He feels his blood pressure is still running high despite recovering from Covid, in addition he is troubled by protracted shortness of breath after COVID-19.  He describes a sense of chest pressure that has occurred on occasion and it feels like "someone is  pushing him up against the wall on his chest".  He does not have a history of diabetes, and checks his blood sugar a couple of times per week.  He has a strong family history of significant hypertension.  His father had a stroke and also had diabetes.  He is unaware of a history of premature coronary artery disease or sudden cardiac death.  He denies a sense of significant palpitations, denies syncope, denies PND orthopnea.  Past Medical History:  Diagnosis Date  . Asthma   . Hypertension     No past surgical history on file.  Current Medications: Current Meds  Medication Sig  . amLODipine (NORVASC) 5 MG tablet Take 1 tablet (5 mg total) by mouth daily.  . [DISCONTINUED] losartan-hydrochlorothiazide (HYZAAR) 100-25 MG tablet Take 1 tablet by mouth daily.     Allergies:   Patient has no known allergies.   Social History   Socioeconomic History  . Marital status: Married    Spouse name: Not on file  . Number of children: Not on file  . Years of education: Not on file  . Highest education level: Not on file  Occupational History  . Not on file  Tobacco Use  . Smoking status: Never Smoker  . Smokeless tobacco: Never Used  Substance and Sexual Activity  . Alcohol use: Yes  . Drug use: No  . Sexual activity: Yes  Other  Topics Concern  . Not on file  Social History Narrative  . Not on file   Social Determinants of Health   Financial Resource Strain:   . Difficulty of Paying Living Expenses:   Food Insecurity:   . Worried About Programme researcher, broadcasting/film/video in the Last Year:   . Barista in the Last Year:   Transportation Needs:   . Freight forwarder (Medical):   Marland Kitchen Lack of Transportation (Non-Medical):   Physical Activity:   . Days of Exercise per Week:   . Minutes of Exercise per Session:   Stress:   . Feeling of Stress :   Social Connections:   . Frequency of Communication with Friends and Family:   . Frequency of Social Gatherings with Friends and Family:    . Attends Religious Services:   . Active Member of Clubs or Organizations:   . Attends Banker Meetings:   Marland Kitchen Marital Status:      Family History: The patient's family history includes Diabetes in his father, maternal grandmother, and paternal grandmother; Hypertension in his father, maternal grandfather, mother, and paternal grandfather.  ROS:   Please see the history of present illness.    All other systems reviewed and are negative.  EKGs/Labs/Other Studies Reviewed:    The following studies were reviewed today:  EKG: Normal sinus rhythm, LVH  Recent Labs: 04/28/2019: ALT 28; BUN 13; Creatinine, Ser 0.92; Hemoglobin 16.0; Platelets 403; Potassium 3.7; Sodium 140; TSH 2.055  Recent Lipid Panel    Component Value Date/Time   CHOL 218 (H) 04/04/2019 1646   TRIG 268 (H) 04/04/2019 1646   HDL 47 04/04/2019 1646   CHOLHDL 4.6 04/04/2019 1646   LDLCALC 124 (H) 04/04/2019 1646    Physical Exam:    VS:  BP (!) 152/100   Pulse 94   Ht 5\' 11"  (1.803 m)   Wt 286 lb (129.7 kg)   SpO2 97%   BMI 39.89 kg/m     Wt Readings from Last 5 Encounters:  07/01/19 286 lb (129.7 kg)  04/20/19 285 lb (129.3 kg)  04/04/19 278 lb (126.1 kg)  07/24/17 257 lb (116.6 kg)  04/17/17 262 lb (118.8 kg)     Constitutional: No acute distress Eyes: sclera non-icteric, normal conjunctiva and lids ENMT: mask in place Cardiovascular: regular rhythm, normal rate, no murmurs. S1 and S2 normal. Radial pulses normal bilaterally. No jugular venous distention.  Respiratory: clear to auscultation bilaterally GI : normal bowel sounds, soft and nontender. No distention.   MSK: extremities warm, well perfused. No edema.  NEURO: grossly nonfocal exam, moves all extremities. PSYCH: alert and oriented x 3, normal mood and affect.   ASSESSMENT:    1. Essential hypertension   2. History of COVID-19   3. DOE (dyspnea on exertion)   4. Chest pain, unspecified type    PLAN:    Essential  hypertension -  ECHOCARDIOGRAM COMPLETE, VAS 04/19/17 RENAL ARTERY DUPLEX  Will start losartan 25 mg daily for treatment of hypertension. He is not sure if he's had a previous renal artery ultrasound, we will obtain this to evaluate secondary causes of hypertension.   Chest pain DOE History of COVID-19 - Plan: EKG 12-Lead, MYOCARDIAL PERFUSION IMAGING, ECHOCARDIOGRAM COMPLETE, Sedimentation rate, C-reactive protein, Troponin I -  He describes chest pain and history of COVID 19 infection. To evaluate further we will obtain a myoview treadmill test, as well as echocardiogram and inflammatory markers to evaluate for pericardial or myocardial  inflammation as a source of chest pain.   Discussed all concerns in great detail and answered all questions to the best of my ability.   Cherlynn Kaiser, MD [place]  [facility]    Medication Adjustments/Labs and Tests Ordered: Current medicines are reviewed at length with the patient today.  Concerns regarding medicines are outlined above.  Orders Placed This Encounter  Procedures  . Sedimentation rate  . C-reactive protein  . Hemoglobin A1c  . Troponin I -  . MYOCARDIAL PERFUSION IMAGING  . EKG 12-Lead  . ECHOCARDIOGRAM COMPLETE  . VAS US RENAL ARTERY DUPLEX   Meds ordered this encounter  Medications  . losartan (COZAAR) 25 MG tablet    Sig: Take 1 tablet (25 mg total) by mouth daily.    Dispense:  90 tablet    Refill:  3    Patient Instructions  Medication Instructions:  Start Losartan 25 mg  One tablet daily   *If you need a refill on your cardiac medications before your next appointment, please call your pharmacy*   Lab Work:  C-rea prot. Sed rate  Trop-I HgbA1c  If you have labs (blood work) drawn today and your tests are completely normal, you will receive your results only by: Marland Kitchen MyChart Message (if you have MyChart) OR . A paper copy in the mail If you have any lab test that is abnormal or we need to change your  treatment, we will call you to review the results.   Testing/Procedures: BOTH TEST WILL BE SCHEDULE AT [address] Your physician has requested that you have an echocardiogram. Echocardiography is a painless test that uses sound waves to create images of your heart. It provides your doctor with information about the size and shape of your heart and how well your heart's chambers and valves are working. This procedure takes approximately one hour. There are no restrictions for this procedure. AND Your physician has requested that you have a lexiscan myoview. Please follow instruction sheet, as given.   WILL BE SCHEDULE AT [place] [address]. Your physician has requested that you have a renal artery duplex. During this test, an ultrasound is used to evaluate blood flow to the kidneys. Allow one hour for this exam. Do not eat after midnight the day before and avoid carbonated beverages. Take your medications as you usually do.   Follow-Up: At [facility], you and your health needs are our priority.  As part of our continuing mission to provide you with exceptional heart care, we have created designated Provider Care Teams.  These Care Teams include your primary Cardiologist (physician) and Advanced Practice Providers (APPs -  Physician Assistants and Nurse Practitioners) who all work together to provide you with the care you need, when you need it.  We recommend signing up for the patient portal called "MyChart".  Sign up information is provided on this After Visit Summary.  MyChart is used to connect with patients for Virtual Visits (Telemedicine).  Patients are able to view lab/test results, encounter notes, upcoming appointments, etc.  Non-urgent messages can be sent to your provider as well.   To learn more about what you can do with MyChart, go to [email or web address].    Your next appointment:    6 to 8 week(s)  The format for your next appointment:     In Person  Provider:      Other Instructions n/a

## 2019-07-02 LAB — SEDIMENTATION RATE: Sed Rate: 3 mm/hr (ref 0–15)

## 2019-07-02 LAB — C-REACTIVE PROTEIN: CRP: 2 mg/L (ref 0–10)

## 2019-07-02 LAB — TROPONIN I: Troponin I: <0.01 ng/mL (ref 0.00–0.04)

## 2019-07-02 LAB — HEMOGLOBIN A1C
Est. average glucose Bld gHb Est-mCnc: 108 mg/dL
Hgb A1c MFr Bld: 5.4 % (ref 4.8–5.6)

## 2019-07-07 ENCOUNTER — Ambulatory Visit (HOSPITAL_COMMUNITY)
Admission: RE | Admit: 2019-07-07 | Discharge: 2019-07-07 | Disposition: A | Discharge status: Home / Self Care | Payer: 59 | Source: Ambulatory Visit | Attending: Cardiology | Admitting: Cardiology

## 2019-07-07 ENCOUNTER — Other Ambulatory Visit: Payer: Self-pay

## 2019-07-07 DIAGNOSIS — R0600 Dyspnea, unspecified: Secondary | ICD-10-CM | POA: Diagnosis present

## 2019-07-07 DIAGNOSIS — R079 Chest pain, unspecified: Secondary | ICD-10-CM | POA: Diagnosis present

## 2019-07-07 DIAGNOSIS — I10 Essential (primary) hypertension: Secondary | ICD-10-CM | POA: Diagnosis present

## 2019-07-07 DIAGNOSIS — R0609 Other forms of dyspnea: Secondary | ICD-10-CM

## 2019-07-20 ENCOUNTER — Encounter (HOSPITAL_COMMUNITY): Payer: Self-pay | Admitting: *Deleted

## 2019-07-20 ENCOUNTER — Telehealth (HOSPITAL_COMMUNITY): Payer: Self-pay | Admitting: *Deleted

## 2019-07-20 NOTE — Telephone Encounter (Signed)
Attempted to leave a message on voicemail in reference to upcoming appointment scheduled for 05/262021.No answer. Mychart letter with instructions sent. Delphia Kaylor, Adelene Idler

## 2019-07-27 ENCOUNTER — Ambulatory Visit (HOSPITAL_BASED_OUTPATIENT_CLINIC_OR_DEPARTMENT_OTHER): Payer: 59

## 2019-07-27 ENCOUNTER — Other Ambulatory Visit: Payer: Self-pay

## 2019-07-27 ENCOUNTER — Ambulatory Visit (HOSPITAL_COMMUNITY): Payer: 59 | Attending: Cardiology

## 2019-07-27 DIAGNOSIS — R079 Chest pain, unspecified: Secondary | ICD-10-CM

## 2019-07-27 DIAGNOSIS — I10 Essential (primary) hypertension: Secondary | ICD-10-CM | POA: Diagnosis not present

## 2019-07-27 DIAGNOSIS — R0600 Dyspnea, unspecified: Secondary | ICD-10-CM | POA: Diagnosis not present

## 2019-07-27 DIAGNOSIS — Z8616 Personal history of COVID-19: Secondary | ICD-10-CM | POA: Diagnosis not present

## 2019-07-27 DIAGNOSIS — R0609 Other forms of dyspnea: Secondary | ICD-10-CM

## 2019-07-27 LAB — ECHOCARDIOGRAM COMPLETE
Height: 71 in
Weight: 4576 oz

## 2019-07-27 MED ORDER — REGADENOSON 0.4 MG/5ML IV SOLN
0.4000 mg | Freq: Once | INTRAVENOUS | Status: AC
  Administered 2019-07-27: 0.4 mg via INTRAVENOUS

## 2019-07-27 MED ORDER — TECHNETIUM TC 99M TETROFOSMIN IV KIT
32.4000 | PACK | Freq: Once | INTRAVENOUS | Status: AC | PRN
  Administered 2019-07-27: 32.4 via INTRAVENOUS
  Filled 2019-07-27: qty 33

## 2019-07-28 ENCOUNTER — Ambulatory Visit (HOSPITAL_COMMUNITY): Payer: 59 | Attending: Cardiology

## 2019-07-28 LAB — MYOCARDIAL PERFUSION IMAGING
LV dias vol: 141 mL (ref 62–150)
LV sys vol: 66 mL
Peak HR: 112 {beats}/min
Rest HR: 82 {beats}/min
SDS: 2
SRS: 0
SSS: 2
TID: 1.03

## 2019-07-28 MED ORDER — TECHNETIUM TC 99M TETROFOSMIN IV KIT
32.4000 | PACK | Freq: Once | INTRAVENOUS | Status: AC | PRN
  Administered 2019-07-28: 32.4 via INTRAVENOUS
  Filled 2019-07-28: qty 33

## 2019-07-29 ENCOUNTER — Other Ambulatory Visit: Payer: Self-pay | Admitting: *Deleted

## 2019-07-29 ENCOUNTER — Ambulatory Visit: Payer: 59 | Admitting: Family Medicine

## 2019-08-03 ENCOUNTER — Encounter: Payer: Self-pay | Admitting: Neurology

## 2019-08-03 ENCOUNTER — Other Ambulatory Visit: Payer: Self-pay

## 2019-08-03 ENCOUNTER — Ambulatory Visit (INDEPENDENT_AMBULATORY_CARE_PROVIDER_SITE_OTHER): Payer: 59 | Admitting: Neurology

## 2019-08-03 VITALS — BP 145/98 | HR 85 | Ht 73.0 in | Wt 290.0 lb

## 2019-08-03 DIAGNOSIS — R0602 Shortness of breath: Secondary | ICD-10-CM

## 2019-08-03 DIAGNOSIS — R0683 Snoring: Secondary | ICD-10-CM

## 2019-08-03 DIAGNOSIS — G4719 Other hypersomnia: Secondary | ICD-10-CM | POA: Diagnosis not present

## 2019-08-03 DIAGNOSIS — R0681 Apnea, not elsewhere classified: Secondary | ICD-10-CM | POA: Diagnosis not present

## 2019-08-03 DIAGNOSIS — E669 Obesity, unspecified: Secondary | ICD-10-CM | POA: Diagnosis not present

## 2019-08-03 DIAGNOSIS — Z87898 Personal history of other specified conditions: Secondary | ICD-10-CM

## 2019-08-03 DIAGNOSIS — R351 Nocturia: Secondary | ICD-10-CM

## 2019-08-03 DIAGNOSIS — R519 Headache, unspecified: Secondary | ICD-10-CM

## 2019-08-03 NOTE — Progress Notes (Signed)
Subjective:    Patient ID: Ricky Velez is a 34 y.o. male.  HPI     Ricky Foley, MD, PhD [facility] [address], [address] [address] [place], [place] [address]  Dear Dr. Neva Velez,   I saw your patient, Ricky Velez, upon your kind request, in my Sleep clinic today for initial consultation of his sleep disorder, in particular, concern for underlying obstructive sleep apnea.  The patient is unaccompanied today.  As you know, Ricky Velez is a 34 year old right-handed gentleman with an underlying medical history of hypertension since teenage years, and obesity, who reports snoring and excessive daytime somnolence as well as witnessed apneas per wife's report.  He has occasionally woken up with a headache.  He has nocturia about twice per average night.  I reviewed your recent office notes.  He has also recently started seeing cardiology because of an episode of chest pain.  He had an echocardiogram and myocardial perfusion scan on 07/27/2019.  He also had a renal artery ultrasound on 07/07/19. He reports a diagnosis of COVID-19 in December 2020.  He has had intermittent shortness of breath while awake and also at night at times.  Shortness of breath is not always associated with activity and may happen while resting.  He is currently off work.  He works as a Radiation protection practitioner.  He works 12-hour shifts, typically from 7 AM to 7 PM.  He is a lifelong non-smoker and drinks alcohol rarely, once every 6 months or so and drinks caffeine in the form of coffee, typically only 1 cup in the mornings.  He is married he lives with his wife and 2 children, ages 36 and 74.  They have 1 dog in the household.  He does not have a TV in the bedroom.  When he is working, his bedtime is around 8 PM and rise time around 4:30 AM.  He reports multiple nighttime awakenings which have become worse in the past 2 to 3 months.  He has also gained weight in the recent past.  He is not aware of any family  history of OSA.  Snoring can be loud and disturbs his wife.  Epworth sleepiness score is 13 out of 24, fatigue severity score is 59 out of 63.  His Past Medical History Is Significant For: Past Medical History:  Diagnosis Date  . Asthma   . Hypertension     His Past Surgical History Is Significant For: History reviewed. No pertinent surgical history.  His Family History Is Significant For: Family History  Problem Relation Age of Onset  . Hypertension Mother   . Diabetes Father   . Hypertension Father   . Diabetes Maternal Grandmother   . Hypertension Maternal Grandfather   . Diabetes Paternal Grandmother   . Hypertension Paternal Grandfather     His Social History Is Significant For: Social History   Socioeconomic History  . Marital status: Married    Spouse name: Not on file  . Number of children: Not on file  . Years of education: Not on file  . Highest education level: Not on file  Occupational History  . Not on file  Tobacco Use  . Smoking status: Never Smoker  . Smokeless tobacco: Never Used  Substance and Sexual Activity  . Alcohol use: Yes  . Drug use: No  . Sexual activity: Yes  Other Topics Concern  . Not on file  Social History Narrative  . Not on file   Social Determinants of  Health   Financial Resource Strain:   . Difficulty of Paying Living Expenses:   Food Insecurity:   . Worried About Charity fundraiser in the Last Year:   . Arboriculturist in the Last Year:   Transportation Needs:   . Film/video editor (Medical):   Marland Kitchen Lack of Transportation (Non-Medical):   Physical Activity:   . Days of Exercise per Week:   . Minutes of Exercise per Session:   Stress:   . Feeling of Stress :   Social Connections:   . Frequency of Communication with Friends and Family:   . Frequency of Social Gatherings with Friends and Family:   . Attends Religious Services:   . Active Member of Clubs or Organizations:   . Attends Archivist Meetings:    Marland Kitchen Marital Status:     His Allergies Are:  No Known Allergies:   His Current Medications Are:  Outpatient Encounter Medications as of 08/03/2019  Medication Sig  . amLODipine (NORVASC) 5 MG tablet Take 1 tablet (5 mg total) by mouth daily.  Marland Kitchen losartan (COZAAR) 25 MG tablet Take 1 tablet (25 mg total) by mouth daily.   No facility-administered encounter medications on file as of 08/03/2019.  :  Review of Systems:  Out of a complete 14 point review of systems, all are reviewed and negative with the exception of these symptoms as listed below: Review of Systems  Neurological:       Here for sleep consult. No prior sleep study pt reports snoring is present.  Epworth Sleepiness Scale 0= would never doze 1= slight chance of dozing 2= moderate chance of dozing 3= high chance of dozing  Sitting and reading:2 Watching TV:3 Sitting inactive in a public place (ex. Theater or meeting):3 As a passenger in a car for an hour without a break:2 Lying down to rest in the afternoon:2 Sitting and talking to someone:0 Sitting quietly after lunch (no alcohol):3 In a car, while stopped in traffic:0 Total:13     Objective:  Neurological Exam  Physical Exam Physical Examination:   Vitals:   08/03/19 0759  BP: (!) 145/98  Pulse: 85    General Examination: The patient is a very pleasant 34 y.o. male in no acute distress. He appears well-developed and well-nourished and well groomed.   HEENT: Normocephalic, atraumatic, pupils are equal, round and reactive to light, extraocular tracking is good without limitation to gaze excursion or nystagmus noted. Hearing is grossly intact. Face is symmetric with normal facial animation. Speech is clear with no dysarthria noted. There is no hypophonia. There is no lip, neck/head, jaw or voice tremor. Neck is supple with full range of passive and active motion. There are no carotid bruits on auscultation. Oropharynx exam reveals: mild mouth dryness, good dental  hygiene and moderate airway crowding, due to thicker soft palate, tonsillar size of 2+, Mallampati is class II, uvula also on the prominent side.  Tongue protrudes centrally in palate elevates symmetrically, neck circumference of 18-7/8 inches.  He has nasal congestion but no obvious postnasal drip.  Chest: Clear to auscultation without wheezing, rhonchi or crackles noted.  Heart: S1+S2+0, regular and normal without murmurs, rubs or gallops noted.   Abdomen: Soft, non-tender and non-distended with normal bowel sounds appreciated on auscultation.  Extremities: There is no pitting edema in the distal lower extremities bilaterally.   Skin: Warm and dry without trophic changes noted.   Musculoskeletal: exam reveals no obvious joint deformities, tenderness or joint  swelling or erythema.   Neurologically:  Mental status: The patient is awake, alert and oriented in all 4 spheres. His immediate and remote memory, attention, language skills and fund of knowledge are appropriate. There is no evidence of aphasia, agnosia, apraxia or anomia. Speech is clear with normal prosody and enunciation. Thought process is linear. Mood is normal and affect is normal.  Cranial nerves II - XII are as described above under HEENT exam.  Motor exam: Normal bulk, strength and tone is noted. There is no tremor, Romberg is negative. Fine motor skills and coordination: grossly intact.  Cerebellar testing: No dysmetria or intention tremor. There is no truncal or gait ataxia.  Sensory exam: intact to light touch in the upper and lower extremities.  Gait, station and balance: He stands easily. No veering to one side is noted. No leaning to one side is noted. Posture is age-appropriate and stance is narrow based. Gait shows normal stride length and normal pace. No problems turning are noted. Tandem walk is unremarkable.                Assessment and Plan:   In summary, Ricky Velez is a very pleasant 34 y.o.-year old  male with an underlying medical history of hypertension since teenage years, and obesity, whose history and physical exam are concerning for obstructive sleep apnea (OSA). I had a long chat with the patient about my findings and the diagnosis of OSA, its prognosis and treatment options. We talked about medical treatments, surgical interventions and non-pharmacological approaches. I explained in particular the risks and ramifications of untreated moderate to severe OSA, especially with respect to developing cardiovascular disease down the Road, including congestive heart failure, difficult to treat hypertension, cardiac arrhythmias, or stroke. Even type 2 diabetes has, in part, been linked to untreated OSA. Symptoms of untreated OSA include daytime sleepiness, memory problems, mood irritability and mood disorder such as depression and anxiety, lack of energy, as well as recurrent headaches, especially morning headaches. We talked about trying to maintain a healthy lifestyle in general, as well as the importance of weight control. We also talked about the importance of good sleep hygiene. I recommended the following at this time: sleep study.  I explained the sleep test procedure to the patient and also outlined possible surgical and non-surgical treatment options of OSA, including the use of a custom-made dental device (which would require a referral to a specialist dentist or oral surgeon), upper airway surgical options (which would involve a referral to an ENT surgeon). I also explained the CPAP treatment option to the patient, who indicated that he would be willing to try CPAP if the need arises. I explained the importance of being compliant with PAP treatment, not only for insurance purposes but primarily to improve His symptoms, and for the patient's long term health benefit, including to reduce His cardiovascular risks. I answered all his questions today and the patient was in agreement. I plan to see him  back after the sleep study is completed and encouraged him to call with any interim questions, concerns, problems or updates.   Thank you very much for allowing me to participate in the care of this nice patient. If I can be of any further assistance to you please do not hesitate to call me at [phone].  Sincerely,   Ricky Foley, MD, PhD

## 2019-08-03 NOTE — Patient Instructions (Signed)

## 2019-08-05 ENCOUNTER — Ambulatory Visit (INDEPENDENT_AMBULATORY_CARE_PROVIDER_SITE_OTHER): Payer: 59 | Admitting: Family Medicine

## 2019-08-05 ENCOUNTER — Other Ambulatory Visit: Payer: Self-pay

## 2019-08-05 ENCOUNTER — Encounter: Payer: Self-pay | Admitting: Family Medicine

## 2019-08-05 VITALS — BP 142/88 | HR 104 | Temp 98.1°F | Ht 73.0 in | Wt 291.0 lb

## 2019-08-05 DIAGNOSIS — I10 Essential (primary) hypertension: Secondary | ICD-10-CM | POA: Diagnosis not present

## 2019-08-05 DIAGNOSIS — Z87898 Personal history of other specified conditions: Secondary | ICD-10-CM | POA: Diagnosis not present

## 2019-08-05 DIAGNOSIS — Z8616 Personal history of COVID-19: Secondary | ICD-10-CM

## 2019-08-05 DIAGNOSIS — R0600 Dyspnea, unspecified: Secondary | ICD-10-CM

## 2019-08-05 DIAGNOSIS — Z8709 Personal history of other diseases of the respiratory system: Secondary | ICD-10-CM

## 2019-08-05 MED ORDER — LOSARTAN POTASSIUM 50 MG PO TABS: 50.0000 mg | ORAL_TABLET | Freq: Every day | ORAL | 1 refills | Status: DC

## 2019-08-05 MED ORDER — FLOVENT HFA 110 MCG/ACT IN AERO: 1.0000 | INHALATION_SPRAY | Freq: Two times a day (BID) | RESPIRATORY_TRACT | 1 refills | Status: DC

## 2019-08-05 NOTE — Progress Notes (Signed)
Subjective:  Patient ID: Ricky Velez, male    DOB: 01-Apr-1985  Age: 34 y.o. MRN: [id number]  CC:  Chief Complaint  Patient presents with  . Shortness of Breath    started after pt had COVID back in december. off and on. pt's employor won't let him return to work until his SOB is resolved. PT brought paper work from [employer] for Dr. to review and go over.pt gets a presure in his chest after exerting him self in some way. pt has be vaccinated for covid.    HPI Ricky Velez presents for   Shortness of breath, chest pain Establish care with me February 1.  ER follow-up at that time as well.  history of hypertension.  Elevated blood pressure urgent care at 175/112.  Plan for outpatient cardiology follow-up.  Has family history of heart disease with mom with a stent in her 73s.  He been on losartan HCTZ 100/25 mg daily for 2 years.  No chest pain since urgent care visit when he saw me in follow-up.  Some work stressors.  Incomplete right bundle branch block on EKG.  Refer to cardiology given family history of early CAD.  Amlodipine 5 mg added to his losartan HCTZ 100/25 mg.  Was advised to follow-up in 2 weeks  February 17 follow-up : Had  2 episodes of dyspnea.  Resolved fairly quickly.  Had not followed up with cardiology at that time.  Phone numbers provided to schedule.  Was referred to sleep specialist discussions possible sleep apnea.  Slight leukocytosis normalized with repeat CBC. Chest x-ray February 25 without active cardiopulmonary disease, lungs were clear.  Cardiology eval April 30.Normal CRP, normal A1c,  normal sed rate, normal troponin,  renal artery duplex without sign of renal artery stenosis.  Echocardiogram with EF 50 to 55%.  No regional wall motion abnormalities.  Low risk stress test. Cardiology appt 6/11.   No recent chest pain, but dyspnea 2-3 times per day, at rest or with activity. 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  Similar  shortness of breath past few months. No recent change. Asthma as child only.no recent wheeze - just can't expand lungs completely.   No fever.   Sleep eval 2 days ago - home sleep test or sleep study planned.   covid positive test in community site in December, short of breath at that time.   No calf pain or swelling. Had stopped losartan hct when started amlodipine (not added). Now on losartan and amlodipine since cardiology visit. (only 25mg  losartan and 5mg  amlodipine).  Home readings 140-150/100.  BP Readings from Last 3 Encounters:  08/05/19 (!) 142/88  08/03/19 (!) 145/98  07/01/19 (!) 152/100    Stress about job and these symptoms only. Denies anxiety otherwise.      History Patient Active Problem List   Diagnosis Date Noted  . Essential hypertension 04/17/2017  . Proteinuria 04/17/2017   Past Medical History:  Diagnosis Date  . Asthma   . Hypertension    No past surgical history on file. No Known Allergies Prior to Admission medications   Medication Sig Start Date End Date Taking? Authorizing Provider  amLODipine (NORVASC) 5 MG tablet Take 1 tablet (5 mg total) by mouth daily. 04/04/19  Yes 04/19/2017, MD  losartan (COZAAR) 25 MG tablet Take 1 tablet (25 mg total) by mouth daily. 07/01/19 09/29/19 Yes 07/03/19, MD   Social History   Socioeconomic History  .  Marital status: Married    Spouse name: Not on file  . Number of children: Not on file  . Years of education: Not on file  . Highest education level: Not on file  Occupational History  . Not on file  Tobacco Use  . Smoking status: Never Smoker  . Smokeless tobacco: Never Used  Substance and Sexual Activity  . Alcohol use: Yes  . Drug use: No  . Sexual activity: Yes  Other Topics Concern  . Not on file  Social History Narrative  . Not on file   Social Determinants of Health   Financial Resource Strain:   . Difficulty of Paying Living Expenses:   Food  Insecurity:   . Worried About Programme researcher, broadcasting/film/video in the Last Year:   . Barista in the Last Year:   Transportation Needs:   . Freight forwarder (Medical):   Marland Kitchen Lack of Transportation (Non-Medical):   Physical Activity:   . Days of Exercise per Week:   . Minutes of Exercise per Session:   Stress:   . Feeling of Stress :   Social Connections:   . Frequency of Communication with Friends and Family:   . Frequency of Social Gatherings with Friends and Family:   . Attends Religious Services:   . Active Member of Clubs or Organizations:   . Attends Banker Meetings:   Marland Kitchen Marital Status:   Intimate Partner Violence:   . Fear of Current or Ex-Partner:   . Emotionally Abused:   Marland Kitchen Physically Abused:   . Sexually Abused:     Review of Systems Per HPI.   Objective:   Vitals:   08/05/19 1346 08/05/19 1349  BP: (!) 144/97 (!) 142/88  Pulse: (!) 104   Temp: 98.1 F (36.7 C)   TempSrc: Temporal   SpO2: 96%   Weight: 291 lb (132 kg)   Height: 6\' 1"  (1.854 m)      Physical Exam Vitals reviewed.  Constitutional:      Appearance: He is well-developed.  HENT:     Head: Normocephalic and atraumatic.  Eyes:     Pupils: Pupils are equal, round, and reactive to light.  Neck:     Vascular: No carotid bruit or JVD.  Cardiovascular:     Rate and Rhythm: Normal rate and regular rhythm.     Heart sounds: Normal heart sounds. No murmur.  Pulmonary:     Effort: Pulmonary effort is normal. No tachypnea, accessory muscle usage or respiratory distress (No distress but he does take deep breaths frequently throughout the visit.  Speaking in full sentences.).     Breath sounds: Normal breath sounds. No decreased breath sounds, wheezing, rhonchi or rales.  Musculoskeletal:     Comments: Calves nontender, no appreciable edema, negative Homans  Skin:    General: Skin is warm and dry.  Neurological:     Mental Status: He is alert and oriented to person, place, and time.         Assessment & Plan:  Ricky Velez is a 34 y.o. male . Essential hypertension - Plan: losartan (COZAAR) 50 MG tablet  Dyspnea, unspecified type - Plan: Ambulatory referral to Pulmonology, D-dimer, quantitative (not at [facility])  History of COVID-19 - Plan: Ambulatory referral to Pulmonology, D-dimer, quantitative (not at [facility])  History of chest pain - Plan: D-dimer, quantitative (not at [facility])  History of asthma - Plan: Ambulatory referral to Pulmonology, fluticasone (FLOVENT HFA) 110 MCG/ACT inhaler  Reassuring cardiac work-up to this point. Exam reassuring. Blood pressure suboptimal. Dyspnea could be related to post Covid symptoms versus reactive airway.   -Less likely PE without previous DVT symptoms but will check D-dimer  -Increase losartan to 50 mg daily  -Refer to pulmonary  -Trial of Flovent for possible reactive airway  -ER/9 one precautions. Recheck 2 weeks  Meds ordered this encounter  Medications  . losartan (COZAAR) 50 MG tablet    Sig: Take 1 tablet (50 mg total) by mouth daily.    Dispense:  90 tablet    Refill:  1  . fluticasone (FLOVENT HFA) 110 MCG/ACT inhaler    Sig: Inhale 1 puff into the lungs in the morning and at bedtime.    Dispense:  1 Inhaler    Refill:  1   Patient Instructions   Cardiology work-up is reassuring, I will check your blood clot test and if that is elevated can schedule a CT scan to evaluate for blood clots in the lungs.  Can try Flovent inhaler to see if that helps with your shortness of breath with history of asthma although that was a while ago.  I am referring you to pulmonary as well.Return to the clinic or go to the nearest emergency room if any of your symptoms worsen or new symptoms occur.  Blood pressure still elevated, increase losartan to 50 mg daily, continue amlodipine same dose for now.  Recheck in 2 weeks.      If you have lab work done today you will be contacted with your lab results within the next 2  weeks.  If you have not heard from [place] then please contact us. The fastest way to get your results is to register for My Chart.   IF you received an x-ray today, you will receive an invoice from [facility]. Please contact [facility] at [phone] with questions or concerns regarding your invoice.   IF you received labwork today, you will receive an invoice from [place]. Please contact [facility] at [phone] with questions or concerns regarding your invoice.   Our billing staff will not be able to assist you with questions regarding bills from these companies.  You will be contacted with the lab results as soon as they are available. The fastest way to get your results is to activate your My Chart account. Instructions are located on the last page of this paperwork. If you have not heard from [place] regarding the results in 2 weeks, please contact this office.       Signed, Merri Ray, MD [facility]

## 2019-08-05 NOTE — Patient Instructions (Addendum)
Cardiology work-up is reassuring, I will check your blood clot test and if that is elevated can schedule a CT scan to evaluate for blood clots in the lungs.  Can try Flovent inhaler to see if that helps with your shortness of breath with history of asthma although that was a while ago.  I am referring you to pulmonary as well.Return to the clinic or go to the nearest emergency room if any of your symptoms worsen or new symptoms occur.  Blood pressure still elevated, increase losartan to 50 mg daily, continue amlodipine same dose for now.  Recheck in 2 weeks.      If you have lab work done today you will be contacted with your lab results within the next 2 weeks.  If you have not heard from [place] then please contact us. The fastest way to get your results is to register for My Chart.   IF you received an x-ray today, you will receive an invoice from [facility]. Please contact [facility] at [phone] with questions or concerns regarding your invoice.   IF you received labwork today, you will receive an invoice from [place]. Please contact [facility] at [phone] with questions or concerns regarding your invoice.   Our billing staff will not be able to assist you with questions regarding bills from these companies.  You will be contacted with the lab results as soon as they are available. The fastest way to get your results is to activate your My Chart account. Instructions are located on the last page of this paperwork. If you have not heard from [place] regarding the results in 2 weeks, please contact this office.

## 2019-08-06 ENCOUNTER — Encounter: Payer: Self-pay | Admitting: Family Medicine

## 2019-08-06 LAB — D-DIMER, QUANTITATIVE: D-DIMER: <0.2 mg/L FEU (ref 0.00–0.49)

## 2019-08-10 ENCOUNTER — Encounter: Payer: Self-pay | Admitting: Pulmonary Disease

## 2019-08-10 ENCOUNTER — Ambulatory Visit: Payer: 59 | Admitting: Pulmonary Disease

## 2019-08-10 ENCOUNTER — Other Ambulatory Visit: Payer: Self-pay

## 2019-08-10 DIAGNOSIS — R0602 Shortness of breath: Secondary | ICD-10-CM | POA: Diagnosis not present

## 2019-08-10 NOTE — Patient Instructions (Signed)
Significant shortness of breath since Covid in December  We will get a breathing study Obtain a high-resolution CT scan of the chest  If above not showing any significant findings then an exercise study may be beneficial  Follow-up in 4 to 6 weeks

## 2019-08-10 NOTE — Progress Notes (Signed)
Ricky Velez    [id number]    February 12, 1986  Primary Care Physician:Greene, Ranell Patrick, MD  Referring Physician: Wendie Agreste, MD [address] [place],  [place] [address]  Chief complaint:  Shortness of breath  HPI:  Patient developed shortness of breath following Covid symptoms in December Had about 2 weeks of symptoms relating to Covid infection  Past history of asthma when he was much younger Was not on any inhalers  Post Covid does remain short of breath Is short of breath with almost all activities and even at rest  Denies a chronic cough  Was tried on inhalers with no significant benefit  Recently evaluated for obstructive sleep apnea and is scheduled to have testing  Denies any wheezing, denies any chest pains or chest discomfort at present  Was evaluated in February for similar symptoms he had a chest x-ray that was negative, a recent chest x-ray about a month ago was also negative for significant changes  He works as an Psychologist, educational  Outpatient Encounter Medications as of 08/10/2019  Medication Sig  . amLODipine (NORVASC) 5 MG tablet Take 1 tablet (5 mg total) by mouth daily.  . fluticasone (FLOVENT HFA) 110 MCG/ACT inhaler Inhale 1 puff into the lungs in the morning and at bedtime.  Marland Kitchen losartan (COZAAR) 50 MG tablet Take 1 tablet (50 mg total) by mouth daily.   No facility-administered encounter medications on file as of 08/10/2019.    Allergies as of 08/10/2019  . (No Known Allergies)    Past Medical History:  Diagnosis Date  . Asthma   . Hypertension     No past surgical history on file.  Family History  Problem Relation Age of Onset  . Hypertension Mother   . Diabetes Father   . Hypertension Father   . Diabetes Maternal Grandmother   . Hypertension Maternal Grandfather   . Diabetes Paternal Grandmother   . Hypertension Paternal Grandfather     Social History   Socioeconomic History  . Marital status: Married   Spouse name: Not on file  . Number of children: Not on file  . Years of education: Not on file  . Highest education level: Not on file  Occupational History  . Not on file  Tobacco Use  . Smoking status: Never Smoker  . Smokeless tobacco: Never Used  Substance and Sexual Activity  . Alcohol use: Yes  . Drug use: No  . Sexual activity: Yes  Other Topics Concern  . Not on file  Social History Narrative  . Not on file   Social Determinants of Health   Financial Resource Strain:   . Difficulty of Paying Living Expenses:   Food Insecurity:   . Worried About Charity fundraiser in the Last Year:   . Arboriculturist in the Last Year:   Transportation Needs:   . Film/video editor (Medical):   Marland Kitchen Lack of Transportation (Non-Medical):   Physical Activity:   . Days of Exercise per Week:   . Minutes of Exercise per Session:   Stress:   . Feeling of Stress :   Social Connections:   . Frequency of Communication with Friends and Family:   . Frequency of Social Gatherings with Friends and Family:   . Attends Religious Services:   . Active Member of Clubs or Organizations:   . Attends Archivist Meetings:   Marland Kitchen Marital Status:   Intimate Partner Violence:   .  Fear of Current or Ex-Partner:   . Emotionally Abused:   Marland Kitchen Physically Abused:   . Sexually Abused:     Review of Systems  Respiratory: Positive for shortness of breath.   All other systems reviewed and are negative.   Vitals:   08/10/19 1356  BP: 128/86  Pulse: (!) 103  Temp: 98.4 F (36.9 C)  SpO2: 98%   Physical Exam  Constitutional: He appears well-developed and well-nourished.  HENT:  Head: Normocephalic and atraumatic.  Eyes: Pupils are equal, round, and reactive to light. Conjunctivae are normal. Right eye exhibits no discharge. Left eye exhibits no discharge.  Neck: No tracheal deviation present. No thyromegaly present.  Cardiovascular: Normal rate and regular rhythm.  Pulmonary/Chest: Effort  normal and breath sounds normal. No respiratory distress. He has no wheezes. He has no rales. He exhibits no tenderness.   Data Reviewed: Recent chest x-ray shows no acute infiltrate  Myocardial perfusion risk study-was stopped early secondary to fingers feeling tingly and shortness of breath.  There was mild decrease in ejection fraction at 45-54 percent  Echocardiogram 5/26-EF of 50 to 55% low normal left ventricular function Normal right ventricular systolic function   Assessment:  Severe shortness of breath both at rest and with activity  The shortness of breath is post Covid infection -Chest x-ray not showing significant infiltrative process -Did not have underlying lung disease -No underlying heart disease  Unexplained shortness of breath -Possibility of interstitial process considered  Patient has been on inhalers and sees no benefit  Plan/Recommendations: We will obtain a pulmonary function test  Obtain a high-resolution CT to rule out an interstitial process  May require an exercise stress test for further evaluation  Continue other lines of care at present  He is being evaluated for obstructive sleep apnea by neurology-study scheduled  Follow-up in 4 to 6 weeks   Virl Diamond MD [facility] 08/10/2019, 2:17 PM  CC: Shade Flood, MD

## 2019-08-11 ENCOUNTER — Ambulatory Visit: Payer: 59 | Admitting: Family Medicine

## 2019-08-11 ENCOUNTER — Telehealth: Payer: Self-pay

## 2019-08-11 NOTE — Telephone Encounter (Signed)
Busy signal when trying to call to schedule HST

## 2019-08-12 ENCOUNTER — Other Ambulatory Visit: Payer: Self-pay

## 2019-08-12 ENCOUNTER — Encounter: Payer: Self-pay | Admitting: Internal Medicine

## 2019-08-12 ENCOUNTER — Ambulatory Visit: Payer: 59 | Admitting: Internal Medicine

## 2019-08-12 VITALS — BP 136/80 | HR 104 | Ht 71.5 in | Wt 294.0 lb

## 2019-08-12 DIAGNOSIS — R0600 Dyspnea, unspecified: Secondary | ICD-10-CM | POA: Diagnosis not present

## 2019-08-12 DIAGNOSIS — R0609 Other forms of dyspnea: Secondary | ICD-10-CM

## 2019-08-12 DIAGNOSIS — Z8616 Personal history of COVID-19: Secondary | ICD-10-CM

## 2019-08-12 DIAGNOSIS — I10 Essential (primary) hypertension: Secondary | ICD-10-CM | POA: Diagnosis not present

## 2019-08-12 DIAGNOSIS — R079 Chest pain, unspecified: Secondary | ICD-10-CM

## 2019-08-12 DIAGNOSIS — E782 Mixed hyperlipidemia: Secondary | ICD-10-CM

## 2019-08-12 NOTE — Patient Instructions (Signed)
Medication Instructions:  No Changes *If you need a refill on your cardiac medications before your next appointment, please call your pharmacy*   Lab Work: None Ordered If you have labs (blood work) drawn today and your tests are completely normal, you will receive your results only by: Marland Kitchen MyChart Message (if you have MyChart) OR . A paper copy in the mail If you have any lab test that is abnormal or we need to change your treatment, we will call you to review the results.   Testing Ordered:  None   Follow-Up: At [facility], you and your health needs are our priority.  As part of our continuing mission to provide you with exceptional heart care, we have created designated Provider Care Teams.  These Care Teams include your primary Cardiologist (physician) and Advanced Practice Providers (APPs -  Physician Assistants and Nurse Practitioners) who all work together to provide you with the care you need, when you need it.  We recommend signing up for the patient portal called "MyChart".  Sign up information is provided on this After Visit Summary.  MyChart is used to connect with patients for Virtual Visits (Telemedicine).  Patients are able to view lab/test results, encounter notes, upcoming appointments, etc.  Non-urgent messages can be sent to your provider as well.   To learn more about what you can do with MyChart, go to [email or web address].    Your next appointment:   1 month(s)  The format for your next appointment:   In Person  Provider:   Weston Brass, MD

## 2019-08-12 NOTE — Progress Notes (Addendum)
Cardiology Office Note:    Date:  08/12/2019   ID:  Ricky Velez, DOB [birth year], MRN [id number]  PCP:  Shade Flood, MD  Cardiologist:  No primary care provider on file.  Electrophysiologist:  None   Referring MD: Shade Flood, MD   Chief Complaint: chest pain, SOB, post covid, HTN  History of Present Illness:    Ricky Velez is a 34 y.o. male with a history of hypertension since the age of 70 as well as asthma who presents today for evaluation of chest pain, shortness of breath, recent Covid infection in January, and worsening hypertension.  He works for [employer].   We reviewed testing - normal renal ultrasound, grossly normal echocardiogram, low risk stress test. Negative CRP and sed rate, normal troponin, negative d dimer. Findings overall reassuring that there is no ischemia or inflammatory process such as pericarditis driving symptoms.   We discussed management of HTN, and at our last visit we started losartan 25 mg daily, which he was on prior to his COVID 19 infection. Tolerating well. Was recently increased by his PCP to 50 mg daily.   Symptoms improving slightly but still somewhat bothersome SOB. No CP. We discussed thorough cardiovascular evaluation performed and we agreed to perform watchful waiting and management of HTN.   Past Medical History:  Diagnosis Date  . Asthma   . Hypertension     No past surgical history on file.  Current Medications: Current Meds  Medication Sig  . amLODipine (NORVASC) 5 MG tablet Take 1 tablet (5 mg total) by mouth daily.  . fluticasone (FLOVENT HFA) 110 MCG/ACT inhaler Inhale 1 puff into the lungs in the morning and at bedtime.  Marland Kitchen losartan (COZAAR) 50 MG tablet Take 1 tablet (50 mg total) by mouth daily.     Allergies:   Patient has no known allergies.   Social History   Socioeconomic History  . Marital status: Married    Spouse name: Not on file  . Number of children: Not on file  .  Years of education: Not on file  . Highest education level: Not on file  Occupational History  . Not on file  Tobacco Use  . Smoking status: Never Smoker  . Smokeless tobacco: Never Used  Vaping Use  . Vaping Use: Never used  Substance and Sexual Activity  . Alcohol use: Yes  . Drug use: No  . Sexual activity: Yes  Other Topics Concern  . Not on file  Social History Narrative  . Not on file   Social Determinants of Health   Financial Resource Strain:   . Difficulty of Paying Living Expenses:   Food Insecurity:   . Worried About Programme researcher, broadcasting/film/video in the Last Year:   . Barista in the Last Year:   Transportation Needs:   . Freight forwarder (Medical):   Marland Kitchen Lack of Transportation (Non-Medical):   Physical Activity:   . Days of Exercise per Week:   . Minutes of Exercise per Session:   Stress:   . Feeling of Stress :   Social Connections:   . Frequency of Communication with Friends and Family:   . Frequency of Social Gatherings with Friends and Family:   . Attends Religious Services:   . Active Member of Clubs or Organizations:   . Attends Banker Meetings:   Marland Kitchen Marital Status:      Family History: The patient's family history includes Diabetes in  his father, maternal grandmother, and paternal grandmother; Hypertension in his father, maternal grandfather, mother, and paternal grandfather.  ROS:   Please see the history of present illness.    All other systems reviewed and are negative.  EKGs/Labs/Other Studies Reviewed:    The following studies were reviewed today:  Recent Labs: 04/28/2019: ALT 28; BUN 13; Creatinine, Ser 0.92; Hemoglobin 16.0; Platelets 403; Potassium 3.7; Sodium 140; TSH 2.055  Recent Lipid Panel    Component Value Date/Time   CHOL 218 (H) 04/04/2019 1646   TRIG 268 (H) 04/04/2019 1646   HDL 47 04/04/2019 1646   CHOLHDL 4.6 04/04/2019 1646   LDLCALC 124 (H) 04/04/2019 1646    Physical Exam:    VS:  BP 136/80    Pulse (!) 104   Ht 5' 11.5" (1.816 m)   Wt 294 lb (133.4 kg)   SpO2 98%   BMI 40.43 kg/m     Wt Readings from Last 5 Encounters:  08/12/19 294 lb (133.4 kg)  08/10/19 291 lb 9.6 oz (132.3 kg)  08/05/19 291 lb (132 kg)  08/03/19 290 lb (131.5 kg)  07/27/19 286 lb (129.7 kg)     Constitutional: No acute distress Eyes: sclera non-icteric, normal conjunctiva and lids ENMT: normal dentition, moist mucous membranes Cardiovascular: regular rhythm, normal rate, no murmurs. S1 and S2 normal. Radial pulses normal bilaterally. No jugular venous distention.  Respiratory: clear to auscultation bilaterally GI : normal bowel sounds, soft and nontender. No distention.   MSK: extremities warm, well perfused. No edema.  NEURO: grossly nonfocal exam, moves all extremities. PSYCH: alert and oriented x 3, normal mood and affect.   ASSESSMENT:    1. Essential hypertension   2. DOE (dyspnea on exertion)   3. Chest pain, unspecified type   4. History of COVID-19   5. Mixed hyperlipidemia    PLAN:    Essential hypertension - per Dr. Paralee Cancel notes, patient was previously on losartan HCTZ. Currently on losartan 50 mg daily and amlodipine 5 mg daily. BP with moderately good control, could titrate medications. Could also add HCTZ back to regimen. We should aim for aggressive control of BP with goal 120/80 mmHg. Continue current therapy at this time per patient preference. We will recheck BP in 1 mo in office and titrate further.   DOE (dyspnea on exertion)  - still bothered by SOB. Seen by pulmonary and recommended PFTs, HRCT, and exercise stress testing to quantify symptoms. OSA eval pending.   Chest pain, unspecified type - no recurrence. No ischemia or pericarditis. May be contributed to elevated BP.  History of COVID-19 - followed by pulmonary and PCP during recovery. No cardiovascular effects noted.   HLD - not on therapy for HLD. LDL 124, Trigs 268. Recommend aggressive diet and lifestyle  modification, and recheck in 6 mo (last checked 04/04/19) and if still elevated, consider pharmacologic therapy.   Total time of encounter: 30 minutes total time of encounter, including 25 minutes spent in face-to-face patient care on the date of this encounter. This time includes coordination of care and counseling regarding above mentioned problem list. Remainder of non-face-to-face time involved reviewing chart documents/testing relevant to the patient encounter and documentation in the medical record. I have independently reviewed documentation from referring provider.   Weston Brass, MD [place]  [facility]    Medication Adjustments/Labs and Tests Ordered: Current medicines are reviewed at length with the patient today.  Concerns regarding medicines are outlined above.  No orders of the defined  types were placed in this encounter.  No orders of the defined types were placed in this encounter.   Patient Instructions  Medication Instructions:  No Changes *If you need a refill on your cardiac medications before your next appointment, please call your pharmacy*   Lab Work: None Ordered If you have labs (blood work) drawn today and your tests are completely normal, you will receive your results only by: Marland Kitchen MyChart Message (if you have MyChart) OR . A paper copy in the mail If you have any lab test that is abnormal or we need to change your treatment, we will call you to review the results.   Testing Ordered:  None   Follow-Up: At [facility], you and your health needs are our priority.  As part of our continuing mission to provide you with exceptional heart care, we have created designated Provider Care Teams.  These Care Teams include your primary Cardiologist (physician) and Advanced Practice Providers (APPs -  Physician Assistants and Nurse Practitioners) who all work together to provide you with the care you need, when you need it.  We recommend signing up for the  patient portal called "MyChart".  Sign up information is provided on this After Visit Summary.  MyChart is used to connect with patients for Virtual Visits (Telemedicine).  Patients are able to view lab/test results, encounter notes, upcoming appointments, etc.  Non-urgent messages can be sent to your provider as well.   To learn more about what you can do with MyChart, go to [email or web address].    Your next appointment:   1 month(s)  The format for your next appointment:   In Person  Provider:   Cherlynn Kaiser, MD

## 2019-08-18 ENCOUNTER — Ambulatory Visit: Payer: 59 | Admitting: Family Medicine

## 2019-08-24 ENCOUNTER — Other Ambulatory Visit: Payer: Self-pay

## 2019-08-24 ENCOUNTER — Ambulatory Visit (INDEPENDENT_AMBULATORY_CARE_PROVIDER_SITE_OTHER): Payer: 59 | Admitting: Pulmonary Disease

## 2019-08-24 DIAGNOSIS — R0602 Shortness of breath: Secondary | ICD-10-CM

## 2019-08-24 LAB — PULMONARY FUNCTION TEST
DL/VA % pred: 134 %
DL/VA: 6.35 ml/min/mmHg/L
DLCO cor % pred: 100 %
DLCO cor: 33.98 ml/min/mmHg
DLCO unc % pred: 100 %
DLCO unc: 33.98 ml/min/mmHg
FEF 25-75 Post: 3.81 L/sec
FEF 25-75 Pre: 3.27 L/sec
FEF2575-%Change-Post: 16 %
FEF2575-%Pred-Post: 87 %
FEF2575-%Pred-Pre: 74 %
FEV1-%Change-Post: 2 %
FEV1-%Pred-Post: 74 %
FEV1-%Pred-Pre: 72 %
FEV1-Post: 3.4 L
FEV1-Pre: 3.33 L
FEV1FVC-%Change-Post: 3 %
FEV1FVC-%Pred-Pre: 101 %
FEV6-%Change-Post: -1 %
FEV6-%Pred-Post: 72 %
FEV6-%Pred-Pre: 72 %
FEV6-Post: 4.04 L
FEV6-Pre: 4.08 L
FEV6FVC-%Pred-Post: 102 %
FEV6FVC-%Pred-Pre: 102 %
FVC-%Change-Post: -1 %
FVC-%Pred-Post: 70 %
FVC-%Pred-Pre: 71 %
FVC-Post: 4.04 L
FVC-Pre: 4.08 L
Post FEV1/FVC ratio: 84 %
Post FEV6/FVC ratio: 100 %
Pre FEV1/FVC ratio: 81 %
Pre FEV6/FVC Ratio: 100 %

## 2019-08-24 NOTE — Progress Notes (Signed)
PFT done today. 

## 2019-08-29 ENCOUNTER — Other Ambulatory Visit: Payer: Self-pay

## 2019-08-29 ENCOUNTER — Ambulatory Visit: Payer: 59 | Admitting: Family Medicine

## 2019-09-09 ENCOUNTER — Ambulatory Visit: Payer: 59 | Admitting: Family Medicine

## 2019-09-12 ENCOUNTER — Encounter: Payer: Self-pay | Admitting: Family Medicine

## 2019-09-21 ENCOUNTER — Inpatient Hospital Stay: Admission: RE | Admit: 2019-09-21 | Payer: 59 | Source: Ambulatory Visit

## 2019-09-26 ENCOUNTER — Ambulatory Visit: Payer: 59 | Admitting: Internal Medicine

## 2019-09-26 ENCOUNTER — Ambulatory Visit: Payer: 59 | Admitting: Pulmonary Disease

## 2019-09-28 ENCOUNTER — Ambulatory Visit (INDEPENDENT_AMBULATORY_CARE_PROVIDER_SITE_OTHER): Payer: 59 | Admitting: Neurology

## 2019-09-28 DIAGNOSIS — G4719 Other hypersomnia: Secondary | ICD-10-CM

## 2019-09-28 DIAGNOSIS — R351 Nocturia: Secondary | ICD-10-CM

## 2019-09-28 DIAGNOSIS — R0681 Apnea, not elsewhere classified: Secondary | ICD-10-CM

## 2019-09-28 DIAGNOSIS — R0683 Snoring: Secondary | ICD-10-CM

## 2019-09-28 DIAGNOSIS — R519 Headache, unspecified: Secondary | ICD-10-CM

## 2019-09-28 DIAGNOSIS — R0602 Shortness of breath: Secondary | ICD-10-CM

## 2019-09-28 DIAGNOSIS — Z87898 Personal history of other specified conditions: Secondary | ICD-10-CM

## 2019-09-28 DIAGNOSIS — G4733 Obstructive sleep apnea (adult) (pediatric): Secondary | ICD-10-CM

## 2019-09-28 DIAGNOSIS — E669 Obesity, unspecified: Secondary | ICD-10-CM

## 2019-09-29 NOTE — Progress Notes (Signed)
Patient referred by Dr. Neva Seat, seen by me on 08/03/19, HST on 7j/28/21.    Please call and notify the patient that the recent home sleep test showed obstructive sleep apnea in the moderate range. While I recommend treatment for this in the form CPAP, his insurance will not approve a sleep study for this. They will likely only approve a trial of autoPAP, which means, that we don't have to bring him in for a sleep study with CPAP, but will let him try an autoPAP machine at home, through a DME company (of his choice, or as per insurance requirement). The DME representative will educate him on how to use the machine, how to put the mask on, etc. I have placed an order in the chart. Please send referral, talk to patient, send report to referring MD. We will need a FU in sleep clinic for 10 weeks post-PAP set up, please arrange that with me or one of our NPs. Thanks,   [name], MD, PhD [facility])

## 2019-09-29 NOTE — Addendum Note (Signed)
Addended by: [name] on: 09/29/2019 07:08 PM   Modules accepted: Orders

## 2019-09-29 NOTE — Procedures (Signed)
Patient Information     First Name: Ricky Last Name: Velez ID: [id number]  Birth Date: 1985/04/18 Age: 34 Gender: Male  Referring Provider: Shade Flood, MD BMI: 38.6 (W=291 lb, H=6' 1'')  Neck Circ.:  19 '' Epworth:  13/24   Sleep Study Information    Study Date: 09/28/19 S/H/A Version: [id number] / 4.1.1528 / 68  History:    34 year old man with a history of hypertension since teenage years, and obesity, who reports snoring and excessive daytime somnolence as well as witnessed apneas per wife's report.  He has occasionally woken up with a headache.  He has nocturia about twice per average night. Summary & Diagnosis:     OSA Recommendations:      This home sleep test demonstrates moderate obstructive sleep apnea with a total AHI of 18.5/hour and O2 nadir of 76%. Treatment with positive airway pressure (in the form of CPAP) is recommended. This will require a full night CPAP titration study for proper treatment settings, O2 monitoring and mask fitting. Based on the severity of the sleep disordered breathing an attended titration study is indicated. However, patient's insurance has denied an attended sleep study; therefore, the patient will be advised to proceed with an autoPAP titration/trial at home for now. Please note that untreated obstructive sleep apnea may carry additional perioperative morbidity. Patients with significant obstructive sleep apnea should receive perioperative PAP therapy and the surgeons and particularly the anesthesiologist should be informed of the diagnosis and the severity of the sleep disordered breathing. The patient should be cautioned not to drive, work at heights, or operate dangerous or heavy equipment when tired or sleepy. Review and reiteration of good sleep hygiene measures should be pursued with any patient. Other causes of the patient's symptoms, including circadian rhythm disturbances, an underlying mood disorder, medication effect and/or an underlying  medical problem cannot be ruled out based on this test. Clinical correlation is recommended. The patient and his referring provider will be notified of the test results. The patient will be seen in follow up in sleep clinic at [facility].  I certify that I have reviewed the raw data recording prior to the issuance of this report in accordance with the standards of the American Academy of Sleep Medicine (AASM).  [name], MD, PhD [facility]) Diplomat, ABPN (Neurology and Sleep)           Sleep Summary  Oxygen Saturation Statistics   Start Study Time: End Study Time: Total Recording Time:  9:46:04 PM 6:37:09 AM 8 h, 51 min  Total Sleep Time % REM of Sleep Time:  6 h, 30 min  34.4    Mean: 95 Minimum: 76 Maximum: 99  Mean of Desaturations Nadirs (%):   91  Oxygen Desaturation. %:   4-9 10-20 >20 Total  Events Number Total    42  12 77.8 22.2  0 0.0  54 100.0  Oxygen Saturation: <90 <=88 <85 <80 <70  Duration (minutes): Sleep % 4.0 1.0  3.0 0.9  0.8 0.2 0.2 0.1 0.0 0.0     Respiratory Indices      Total Events REM NREM All Night  pRDI:  136  pAHI:  99 ODI:  54  pAHIc:  0  % CSR: 0.0 46.5 37.7 25.7 0.0 19.0 12.7 5.4 0.0 25.4 18.5 10.1 0.0       Pulse Rate Statistics during Sleep (BPM)      Mean: 79 Minimum: 60 Maximum: 120    Indices are calculated  using technically valid sleep time of 5 h, 21 min. pRDI/pAHI are calculated using oxi desaturations ? 3%  Body Position Statistics  Position Supine Prone Right Left Non-Supine  Sleep (min) 121.6 20.0 161.5 87.0 268.5  Sleep % 31.2 5.1 41.4 22.3 68.8  pRDI 42.2 N/A 12.8 21.5 17.2  pAHI 34.2 N/A 9.1 13.7 10.8  ODI 18.2 N/A 5.8 5.9 6.1     Snoring Statistics Snoring Level (dB) >40 >50 >60 >70 >80 >Threshold (45)  Sleep (min) 351.2 20.6 4.1 0.0 0.0 80.4  Sleep % 90.0 5.3 1.0 0.0 0.0 20.6    Mean: 43 dB Sleep Stages Chart                            pAHI=18.5                                         Mild              Moderate                    Severe                                                 5              15                    30

## 2019-10-03 ENCOUNTER — Ambulatory Visit: Payer: 59 | Admitting: Family Medicine

## 2019-10-06 NOTE — Telephone Encounter (Signed)
Attempted to reach the pt via phone. Phone # on file was busy and could not connect. My chart message sent on 10/04/2019.

## 2020-08-08 ENCOUNTER — Ambulatory Visit (INDEPENDENT_AMBULATORY_CARE_PROVIDER_SITE_OTHER): Payer: Self-pay | Admitting: Neurology

## 2020-08-08 ENCOUNTER — Encounter: Payer: Self-pay | Admitting: Neurology

## 2020-08-08 VITALS — BP 153/95 | HR 88 | Ht 71.0 in | Wt 271.0 lb

## 2020-08-08 DIAGNOSIS — R634 Abnormal weight loss: Secondary | ICD-10-CM

## 2020-08-08 DIAGNOSIS — G4733 Obstructive sleep apnea (adult) (pediatric): Secondary | ICD-10-CM

## 2020-08-08 NOTE — Patient Instructions (Addendum)
We will set you up at home with a so called autoPAP machine for treatment of your moderate obstructive sleep apnea, based on your home sleep test results from July 2021.  You will need a follow-up after you have started using your AutoPap machine and be seen in this clinic within 30 to 90 days of starting treatment. Please use your autoPAP regularly. While your insurance requires that you use PAP at least 4 hours each night on 70% of the nights, I recommend, that you not skip any nights and use it throughout the night if you can. Getting used to PAP and staying with the treatment long term does take time and patience and discipline. Untreated obstructive sleep apnea when it is moderate to severe can have an adverse impact on cardiovascular health and raise her risk for heart disease, arrhythmias, hypertension, congestive heart failure, stroke and diabetes. Untreated obstructive sleep apnea causes sleep disruption, nonrestorative sleep, and sleep deprivation. This can have an impact on your day to day functioning and cause daytime sleepiness and impairment of cognitive function, memory loss, mood disturbance, and problems focussing. Using PAP regularly can improve these symptoms.  Alternative treatment options may include an oral appliance or surgical treatments.  Please continue to work on weight loss.

## 2020-08-08 NOTE — Progress Notes (Signed)
Subjective:    Patient ID: Ricky Velez is a 35 y.o. male.  HPI     Interim history:    Ricky Velez is a 36 year old right-handed gentleman with an underlying medical history of hypertension since teenage years, and obesity, who presents for follow-up consultation of his obstructive sleep apnea.  The patient is unaccompanied today.  I first met him at the request of his primary care physician on 08/03/2019, at which time the patient reported snoring, excessive daytime somnolence, witnessed apneas and occasional morning headaches.  He was advised to proceed with a sleep study.  He had a home sleep test on 09/28/2019 which showed moderate obstructive sleep apnea with an estimated AHI of 18.5/h, O2 nadir 76%.  He was advised to start AutoPap therapy.  He did not start treatment at the time.  Today, 08/08/2020: He reports working on weight loss.  In fact, compared to June last year he has lost about 20 pounds.  He reports that his snoring is a little better.  He would be interested in treating his sleep apnea but would like to talk about different options including the oral appliance as he has looked into it.  He has a new job, he works for [employer] as a Administrator.  He has a DOT physical pending for August.  He reports a bedtime between 730 and 8 and a rise time between 430 and 5 AM.  He has nocturia about once or twice per average night and denies any recent morning headaches but had some morning headaches in the past.  His blood pressure numbers have improved.  Today's blood pressure is a little higher than it has been.  The patient's allergies, current medications, family history, past medical history, past social history, past surgical history and problem list were reviewed and updated as appropriate.   Previously:   08/03/19: (He) reports snoring and excessive daytime somnolence as well as witnessed apneas per wife's report.  He has occasionally woken up with a headache.  He has nocturia  about twice per average night.  I reviewed your recent office notes.  He has also recently started seeing cardiology because of an episode of chest pain.  He had an echocardiogram and myocardial perfusion scan on 07/27/2019.  He also had a renal artery ultrasound on 07/07/19. He reports a diagnosis of COVID-19 in December 2020.  He has had intermittent shortness of breath while awake and also at night at times.  Shortness of breath is not always associated with activity and may happen while resting.  He is currently off work.  He works as a Audiological scientist.  He works 12-hour shifts, typically from 7 AM to 7 PM.  He is a lifelong non-smoker and drinks alcohol rarely, once every 6 months or so and drinks caffeine in the form of coffee, typically only 1 cup in the mornings.  He is married he lives with his wife and 2 children, ages 80 and 23.  They have 1 dog in the household.  He does not have a TV in the bedroom.  When he is working, his bedtime is around 8 PM and rise time around 4:30 AM.  He reports multiple nighttime awakenings which have become worse in the past 2 to 3 months.  He has also gained weight in the recent past.  He is not aware of any family history of OSA.  Snoring can be loud and disturbs his wife.  Epworth sleepiness score is 13 out of 24, fatigue  severity score is 59 out of 63.  His Past Medical History Is Significant For: Past Medical History:  Diagnosis Date  . Asthma   . Hypertension     His Past Surgical History Is Significant For: History reviewed. No pertinent surgical history.  His Family History Is Significant For: Family History  Problem Relation Age of Onset  . Hypertension Mother   . Diabetes Father   . Hypertension Father   . Diabetes Maternal Grandmother   . Hypertension Maternal Grandfather   . Diabetes Paternal Grandmother   . Hypertension Paternal Grandfather     His Social History Is Significant For: Social History   Socioeconomic History  . Marital status:  Married    Spouse name: Not on file  . Number of children: Not on file  . Years of education: Not on file  . Highest education level: Not on file  Occupational History  . Not on file  Tobacco Use  . Smoking status: Never Smoker  . Smokeless tobacco: Never Used  Vaping Use  . Vaping Use: Never used  Substance and Sexual Activity  . Alcohol use: Yes  . Drug use: No  . Sexual activity: Yes  Other Topics Concern  . Not on file  Social History Narrative  . Not on file   Social Determinants of Health   Financial Resource Strain: Not on file  Food Insecurity: Not on file  Transportation Needs: Not on file  Physical Activity: Not on file  Stress: Not on file  Social Connections: Not on file    His Allergies Are:  No Known Allergies:   His Current Medications Are:  Outpatient Encounter Medications as of 08/08/2020  Medication Sig  . amLODipine (NORVASC) 5 MG tablet Take 1 tablet (5 mg total) by mouth daily.  Marland Kitchen losartan (COZAAR) 50 MG tablet Take 1 tablet (50 mg total) by mouth daily.  . [DISCONTINUED] fluticasone (FLOVENT HFA) 110 MCG/ACT inhaler Inhale 1 puff into the lungs in the morning and at bedtime. (Patient not taking: Reported on 08/08/2020)   No facility-administered encounter medications on file as of 08/08/2020.  :  Review of Systems:  Out of a complete 14 point review of systems, all are reviewed and negative with the exception of these symptoms as listed below:  Review of Systems  Neurological:       Here for f/u on sleep. Sleep study was completed back in 2021, moderate osa was found. Did not start cpap due to pandemic. 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 male in no acute distress. He appears well-developed and well-nourished and  well groomed.   HEENT: Normocephalic, atraumatic, pupils are equal, round and reactive to light, extraocular tracking is good without limitation to gaze excursion or nystagmus noted. Hearing is grossly intact. Face is symmetric with normal facial animation. Speech is clear with no dysarthria noted. There is no hypophonia. There is no lip, neck/head, jaw or voice tremor. Neck is supple with full range of passive and active motion. There are no carotid bruits on auscultation. Oropharynx exam reveals: mild mouth dryness, good dental hygiene and moderate airway crowding.  Tongue protrudes centrally in palate elevates symmetrically. Minimal overbite.  Chest: Clear to auscultation without wheezing, rhonchi  or crackles noted.  Heart: S1+S2+0, regular and normal without murmurs, rubs or gallops noted.   Abdomen: Soft, non-tender and non-distended.  Extremities: There is no obvious edema.   Skin: Warm and dry without trophic changes noted.   Musculoskeletal: exam reveals no obvious joint deformities.   Neurologically:  Mental status: The patient is awake, alert and oriented in all 4 spheres. His immediate and remote memory, attention, language skills and fund of knowledge are appropriate. There is no evidence of aphasia, agnosia, apraxia or anomia. Speech is clear with normal prosody and enunciation. Thought process is linear. Mood is normal and affect is normal.  Cranial nerves II - XII are as described above under HEENT exam.  Motor exam: Normal bulk, and strength grossly. Fine motor skills and coordination: grossly intact.  Cerebellar testing: No dysmetria or intention tremor. There is no truncal or gait ataxia.  Sensory exam: intact to light touch in the upper and lower extremities.  Gait, station and balance: He stands easily. No veering to one side is noted. No leaning to one side is noted. Posture is age-appropriate and stance is narrow based. Gait shows normal stride length and normal  pace. No problems turning are noted.            Assessment and Plan:   In summary, Ricky Velez is a very pleasant 35 year old male with an underlying medical history of hypertension since teenage years, and obesity, who presents for reevaluation of his sleep disturbance.  He was diagnosed with moderate obstructive sleep apnea based on home sleep test on 09/28/2019, AHI was 18.5/h, O2 nadir 76%.  He has achieved interim weight loss and is commended for his weight loss endeavor.  He is advised to start treatment for his obstructive sleep apnea in the form of AutoPap therapy.  We talked about alternative options including a dental device or surgical options such as the implanted hypoglossal nerve stimulator called inspire and airway surgery.  He is advised that weight loss can continue to help reduce the severity of his sleep apnea and we can certainly seek reevaluation at some point in the near future.  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  He is advised to follow-up within the first 30 to 90 days after his set up with AutoPap therapy at home.  He is encouraged to be fully compliant with treatment.  We will plan to follow-up according to his set up date.  I answered all his questions today and he was in agreement with the plan.   I spent 30 minutes in total face-to-face time and in reviewing records during pre-charting, more than 50% of which was spent in counseling and coordination of care, reviewing test results, reviewing medications and treatment regimen and/or in discussing or reviewing the diagnosis of OSA, the prognosis and treatment options. Pertinent laboratory and imaging test results that were available during this visit with the patient were reviewed by me and considered in my medical decision making (see chart for details).

## 2020-09-04 ENCOUNTER — Encounter: Payer: Self-pay | Admitting: Neurology

## 2020-09-05 NOTE — Telephone Encounter (Signed)
Secure message sent to [facility] team members.

## 2020-09-05 NOTE — Telephone Encounter (Signed)
Heard back from staff at [facility]. Awaiting inventory. Order has been processed however.

## 2020-10-14 ENCOUNTER — Encounter: Payer: Self-pay | Admitting: Neurology

## 2020-10-18 ENCOUNTER — Encounter: Payer: Self-pay | Admitting: *Deleted

## 2020-10-18 NOTE — Progress Notes (Signed)
Retracted letter for now.

## 2020-10-18 NOTE — Telephone Encounter (Signed)
Error

## 2020-11-07 ENCOUNTER — Ambulatory Visit: Payer: 59 | Admitting: Neurology

## 2020-11-07 ENCOUNTER — Encounter: Payer: Self-pay | Admitting: *Deleted

## 2020-11-07 ENCOUNTER — Encounter: Payer: Self-pay | Admitting: Neurology

## 2020-11-07 VITALS — BP 149/93 | HR 87 | Ht 71.0 in | Wt 281.8 lb

## 2020-11-07 DIAGNOSIS — Z9989 Dependence on other enabling machines and devices: Secondary | ICD-10-CM | POA: Diagnosis not present

## 2020-11-07 DIAGNOSIS — G4733 Obstructive sleep apnea (adult) (pediatric): Secondary | ICD-10-CM

## 2020-11-07 NOTE — Progress Notes (Addendum)
Subjective:    Patient ID: Ricky Velez is a 35 y.o. male.  HPI    Interim history:    Mr. Ledonne is a 35 year old right-handed gentleman with an underlying medical history of hypertension since teenage years, and obesity, who presents for follow-up consultation of his obstructive sleep apnea, after starting autoPAP therapy.  The patient is unaccompanied today.   I last saw him on 08/08/2020, at which time we talked about his home sleep test from July 2021.  He had moderate obstructive sleep apnea per AHI of 18.5, O2 nadir 76%.  He has been working on weight loss and has lost about 20 pounds compared to the year before.  He had a DOT physical coming up.  We talked about treatment options for sleep apnea and he was agreeable to starting AutoPap therapy.  His set up date was 10/09/2020.  He has a Resvent IBreeze 20A (compliance available through [employer]).  Today, 11/07/2020: I reviewed his AutoPap compliance data from 10/09/2020 through 11/07/2020, which is a total of 30 days, during which time he used his machine every night, denies usage is of course not there yet.  He is 100% compliant.  Average usage of 5.3 hours, average AHI at goal at 0.5/h, 95th percentile of pressure at 5.4 cm with a range of 5 to 12 cm, maximum pressure for the past 30 days at 6.6 cm, leak on the lower side with a 95th percentile at 1.3 L/min.  He reports using nasal pillows.  His wife has given him feedback that he barely snores any longer.  He has been adjusting to treatment, sometimes wakes up with discomfort and sometimes pulls at the mass.  He is still working on consistently using it (corrected sentence on 09/02/22), but is very motivated to continue with it.  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  I would be happy to write a letter of support.  He is working on weight loss, reports that his mom had been staying with him, visiting from [place] and she was  cooking a lot for them, he has had some weight gain.  He has had no changes in his medical history or medications.  His Epworth sleepiness score is 0 out of 24.   The patient's allergies, current medications, family history, past medical history, past social history, past surgical history and problem list were reviewed and updated as appropriate.    Previously:     I first met him at the request of his primary care physician on 08/03/2019, at which time the patient reported snoring, excessive daytime somnolence, witnessed apneas and occasional morning headaches.  He was advised to proceed with a sleep study.  He had a home sleep test on 09/28/2019 which showed moderate obstructive sleep apnea with an estimated AHI of 18.5/h, O2 nadir 76%.  He was advised to start AutoPap therapy.  He did not start treatment at the time.     08/03/19: (He) reports snoring and excessive daytime somnolence as well as witnessed apneas per wife's report.  He has occasionally woken up with a headache.  He has nocturia about twice per average night.  I reviewed your recent office notes.  He has also recently started seeing cardiology because of an episode of chest pain.  He had an echocardiogram and myocardial perfusion scan on 07/27/2019.  He also had a renal artery ultrasound on 07/07/19. He reports a diagnosis of  COVID-19 in December 2020.  He has had intermittent shortness of breath while awake and also at night at times.  Shortness of breath is not always associated with activity and may happen while resting.  He is currently off work.  He works as a Radiation protection practitioner.  He works 12-hour shifts, typically from 7 AM to 7 PM.  He is a lifelong non-smoker and drinks alcohol rarely, once every 6 months or so and drinks caffeine in the form of coffee, typically only 1 cup in the mornings.  He is married he lives with his wife and 2 children, ages 47 and 1.  They have 1 dog in the household.  He does not have a TV in the bedroom.  When he is  working, his bedtime is around 8 PM and rise time around 4:30 AM.  He reports multiple nighttime awakenings which have become worse in the past 2 to 3 months.  He has also gained weight in the recent past.  He is not aware of any family history of OSA.  Snoring can be loud and disturbs his wife.  Epworth sleepiness score is 13 out of 24, fatigue severity score is 59 out of 63.    His Past Medical History Is Significant For: Past Medical History:  Diagnosis Date   Asthma    Hypertension     His Past Surgical History Is Significant For: History reviewed. No pertinent surgical history.  His Family History Is Significant For: Family History  Problem Relation Age of Onset   Hypertension Mother    Diabetes Father    Hypertension Father    Diabetes Maternal Grandmother    Hypertension Maternal Grandfather    Diabetes Paternal Grandmother    Hypertension Paternal Grandfather     His Social History Is Significant For: Social History   Socioeconomic History   Marital status: Married    Spouse name: Not on file   Number of children: Not on file   Years of education: Not on file   Highest education level: Not on file  Occupational History   Not on file  Tobacco Use   Smoking status: Never   Smokeless tobacco: Never  Vaping Use   Vaping Use: Never used  Substance and Sexual Activity   Alcohol use: Yes   Drug use: No   Sexual activity: Yes  Other Topics Concern   Not on file  Social History Narrative   Not on file   Social Determinants of Health   Financial Resource Strain: Not on file  Food Insecurity: Not on file  Transportation Needs: Not on file  Physical Activity: Not on file  Stress: Not on file  Social Connections: Not on file    His Allergies Are:  No Known Allergies:   His Current Medications Are:  Outpatient Encounter Medications as of 11/07/2020  Medication Sig   losartan (COZAAR) 50 MG tablet Take 1 tablet (50 mg total) by mouth daily.   amLODipine  (NORVASC) 5 MG tablet Take 1 tablet (5 mg total) by mouth daily.   No facility-administered encounter medications on file as of 11/07/2020.  :  Review of Systems:  Out of a complete 14 point review of systems, all are reviewed and negative with the exception of these symptoms as listed below:   Review of Systems  Neurological:        Initial cpap f/u. ESS:0.   Objective:  Neurological Exam  Physical Exam Physical Examination:   Vitals:   11/07/20 1301  BP: (!) 149/93  Pulse: 87    General Examination: The patient is a very pleasant 35 y.o. male in no acute distress. He appears well-developed and well-nourished and well groomed.   HEENT: Normocephalic, atraumatic, pupils are equal, round and reactive to light, extraocular tracking is well-preserved.  Hearing is grossly intact. Face is symmetric with normal facial animation. Speech is clear with no dysarthria noted. There is no hypophonia. There is no lip, neck/head, jaw or voice tremor. Neck is supple with full range of passive and active motion. There are no carotid bruits on auscultation. Oropharynx exam reveals: No significant mouth dryness, good dental hygiene and moderate airway crowding.  Tongue protrudes centrally and palate elevates symmetrically.    Chest: Clear to auscultation without wheezing, rhonchi or crackles noted.   Heart: S1+S2+0, regular and normal without murmurs, rubs or gallops noted.    Abdomen: Soft, non-tender and non-distended.   Extremities: There is no obvious edema.    Skin: Warm and dry without trophic changes noted.    Musculoskeletal: exam reveals no obvious joint deformities.    Neurologically:  Mental status: The patient is awake, alert and oriented in all 4 spheres. His immediate and remote memory, attention, language skills and fund of knowledge are appropriate. There is no evidence of aphasia, agnosia, apraxia or anomia. Speech is clear with normal prosody and enunciation. Thought process is  linear. Mood is normal and affect is normal.  Cranial nerves II - XII are as described above under HEENT exam.  Motor exam: Normal bulk, and strength grossly. Fine motor skills and coordination: grossly intact.  Cerebellar testing: No dysmetria or intention tremor. There is no truncal or gait ataxia.  Sensory exam: intact to light touch in the upper and lower extremities.  Gait, station and balance: He stands easily. No veering to one side is noted. No leaning to one side is noted. Posture is age-appropriate and stance is narrow based. Gait shows normal stride length and normal pace. No problems turning are noted.             Assessment and Plan:    In summary, Bardo Giustino Velez is a very pleasant 35 year old male with an underlying medical history of hypertension since teenage years, and obesity, who presents for reevaluation of his sleep disturbance.  He was diagnosed with moderate obstructive sleep apnea based on home sleep test on 09/28/2019, AHI was 18.5/h, O2 nadir 76%.  He has had some weight fluctuation, he started AutoPap therapy on 10/09/2020 and has been fully compliant with treatment, apnea scores look good, leak on the low side, average pressure on the lower end of the spectrum as well.  He is still adjusting to treatment.  Wife reports to him that his snoring has been under complete control.  He is encouraged to continue with full compliance on his AutoPap treatment.  He is furthermore advised that he can take 3+ months to get fully adjusted to treatment and also reap full benefit from it.  Ultimately, his blood pressure numbers may improve over time as well.   He requested a letter of support for his DOT physician.  I would be happy to write a letter.  He is using nasal pillows.  Leak is on the low side from the mask.  He is reminded to change his supplies on a regular basis, filter on a monthly basis.  At this juncture, he is advised to follow-up routinely in this clinic in 1 year.  He is  encouraged to continue to work on weight loss as he has had some interim weight gain.  I answered all his questions today and he was in agreement with the plan.  I spent 30 minutes in total face-to-face time and in reviewing records during pre-charting, more than 50% of which was spent in counseling and coordination of care, reviewing test results, reviewing medications and treatment regimen and/or in discussing or reviewing the diagnosis of OSA, the prognosis and treatment options. Pertinent laboratory and imaging test results that were available during this visit with the patient were reviewed by me and considered in my medical decision making (see chart for details).

## 2020-11-07 NOTE — Patient Instructions (Signed)
It was nice to see you again today.  I am glad to hear that you were adjusting well to your AutoPap machine.  You are fully compliant with treatment, keep up the good work!  Please continue to work on weight loss as you have been able to lose some weight in the past but had some fluctuation recently.    I would be happy to furnish a letter of support so you can show your DOT physician.  He can also pull up your current progress note in Epic.   Please continue using your autoPAP regularly. While your insurance requires that you use PAP at least 4 hours each night on 70% of the nights, I recommend, that you not skip any nights and use it throughout the night if you can. Getting used to PAP and staying with the treatment long term does take time and patience and discipline. Untreated obstructive sleep apnea when it is moderate to severe can have an adverse impact on cardiovascular health and raise her risk for heart disease, arrhythmias, hypertension, congestive heart failure, stroke and diabetes. Untreated obstructive sleep apnea causes sleep disruption, nonrestorative sleep, and sleep deprivation. This can have an impact on your day to day functioning and cause daytime sleepiness and impairment of cognitive function, memory loss, mood disturbance, and problems focussing. Using PAP regularly can improve these symptoms. Please follow-up routinely to see one of our nurse practitioners in 1 year.

## 2020-11-12 ENCOUNTER — Ambulatory Visit: Payer: Self-pay | Admitting: Adult Health

## 2020-11-13 IMAGING — DX DG CHEST 2V
2 series · 2 of 2 positions shown · non-contrast
Comparison: None.

CLINICAL DATA: Chest pain.  Hypertension

EXAM:
CHEST - 2 VIEW

[chest pa]
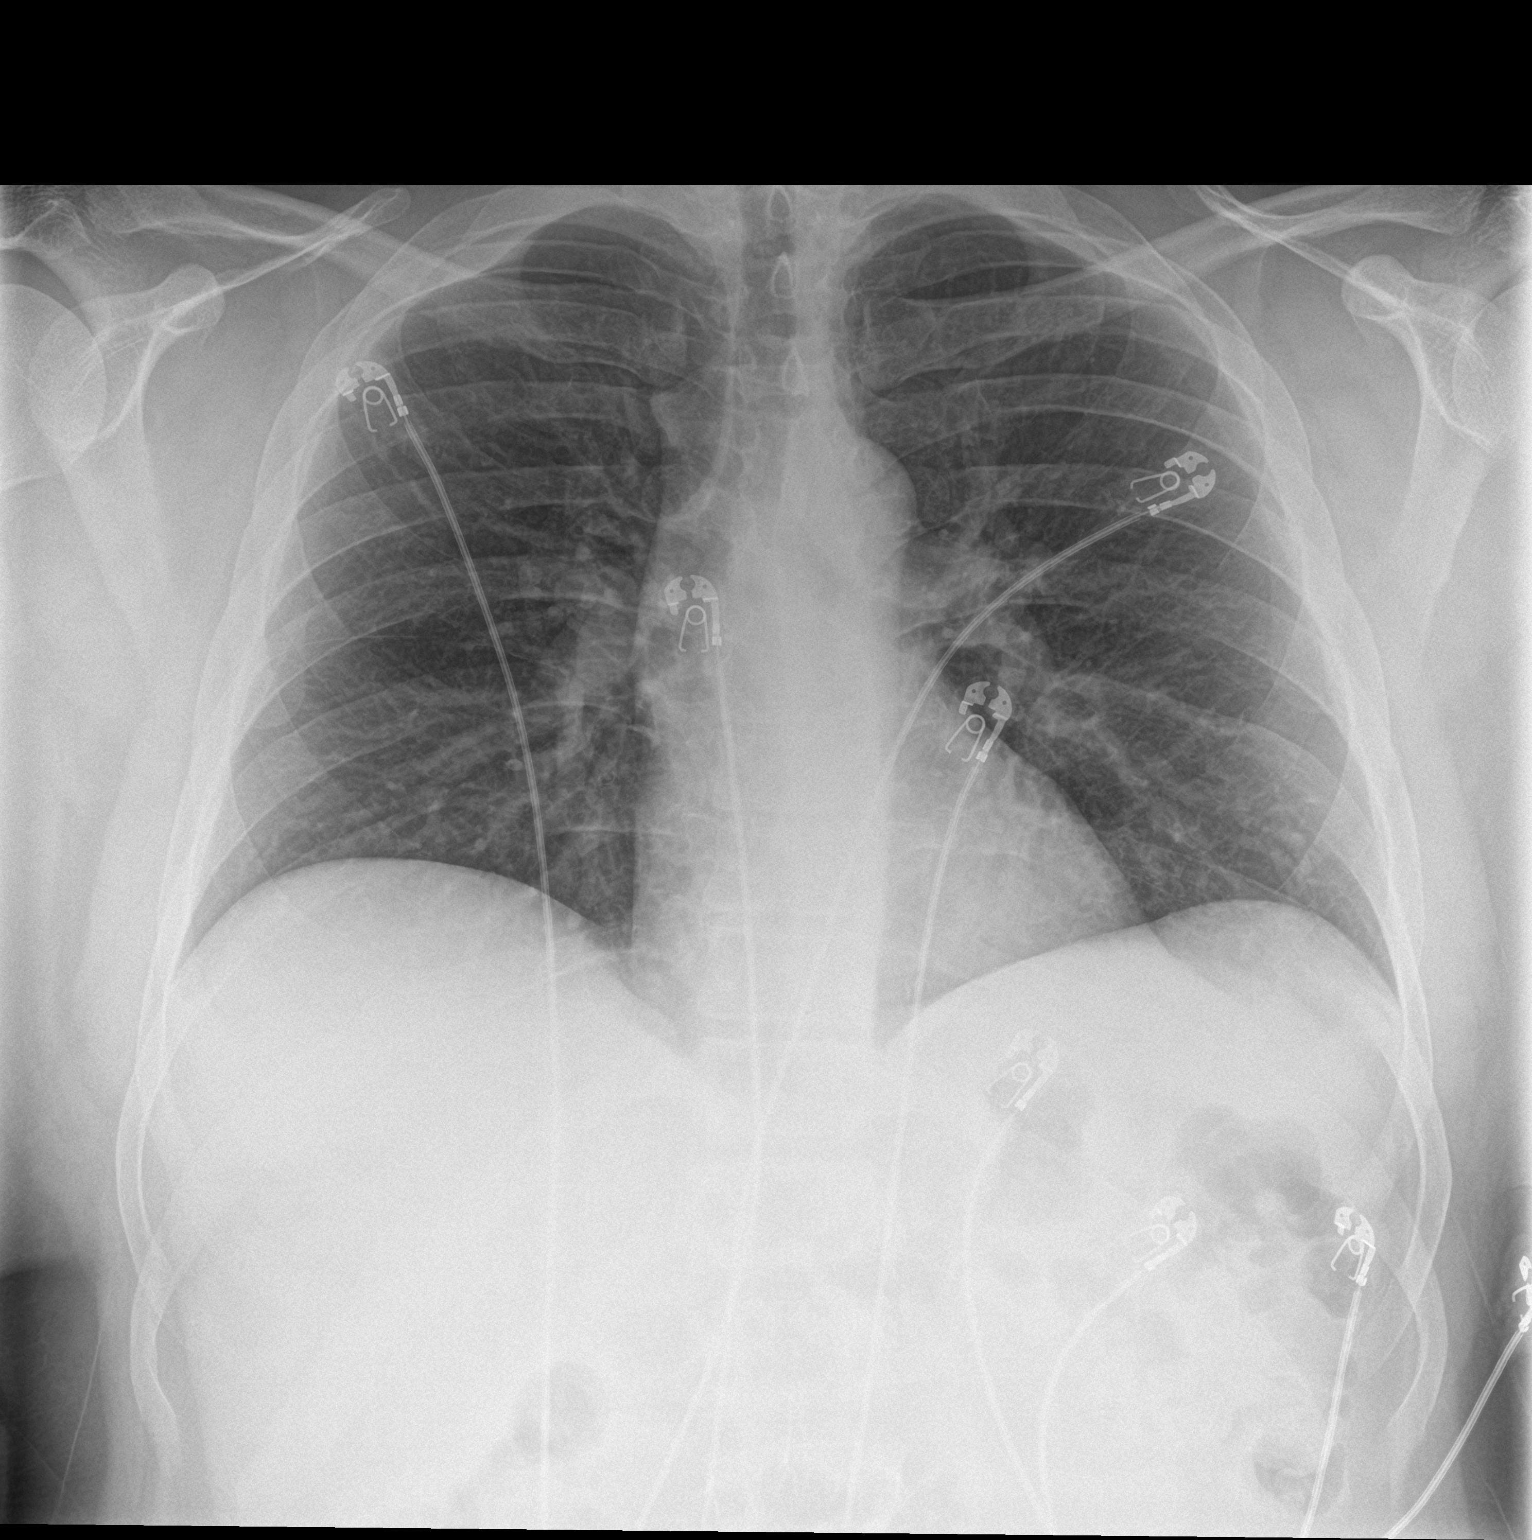

[chest lat]
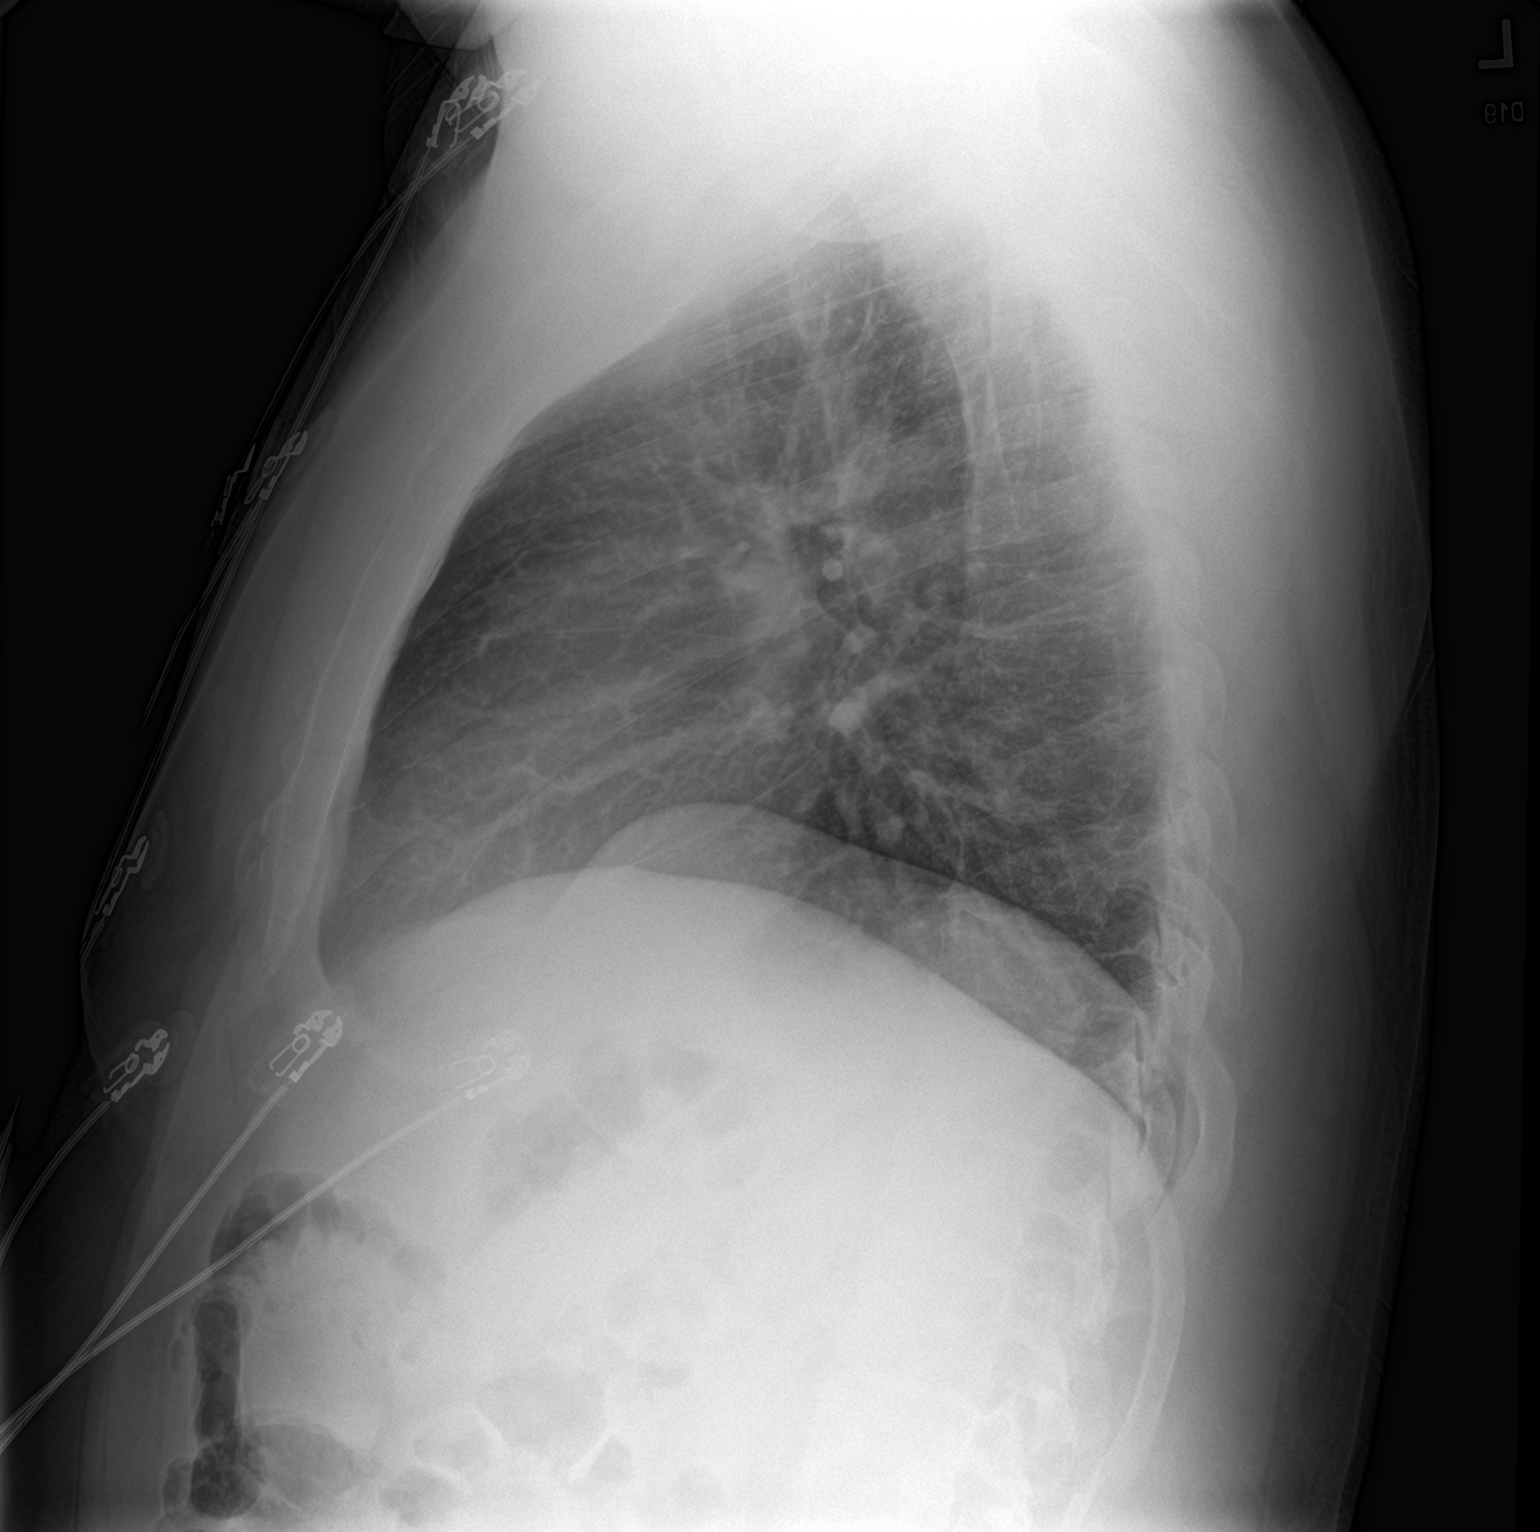

[2 of 2 positions shown; findings below may reference images not displayed]

FINDINGS: The heart size and mediastinal contours are within normal limits.
Both lungs are clear. The visualized skeletal structures are
unremarkable.
IMPRESSION: No active cardiopulmonary disease.

## 2020-11-15 ENCOUNTER — Other Ambulatory Visit: Payer: Self-pay

## 2020-11-15 ENCOUNTER — Ambulatory Visit (INDEPENDENT_AMBULATORY_CARE_PROVIDER_SITE_OTHER): Payer: 59 | Admitting: Internal Medicine

## 2020-11-15 ENCOUNTER — Encounter: Payer: Self-pay | Admitting: Internal Medicine

## 2020-11-15 VITALS — BP 146/101 | HR 81 | Temp 98.5°F | Ht 71.0 in | Wt 275.9 lb

## 2020-11-15 DIAGNOSIS — G4733 Obstructive sleep apnea (adult) (pediatric): Secondary | ICD-10-CM | POA: Diagnosis not present

## 2020-11-15 DIAGNOSIS — Z9989 Dependence on other enabling machines and devices: Secondary | ICD-10-CM

## 2020-11-15 DIAGNOSIS — I10 Essential (primary) hypertension: Secondary | ICD-10-CM

## 2020-11-15 DIAGNOSIS — R809 Proteinuria, unspecified: Secondary | ICD-10-CM

## 2020-11-15 MED ORDER — LOSARTAN POTASSIUM-HCTZ 100-25 MG PO TABS: 1.0000 | ORAL_TABLET | Freq: Every day | ORAL | 11 refills | Status: DC

## 2020-11-15 NOTE — Patient Instructions (Addendum)
Dear Ricky Velez,  Thank you for trusting [place] with your care today.   Today we discussed your high blood pressure, protein in your urine, and DOT clearance.   We have increased your hyzaar to 100-25mg .   We will obtain blood work and a urine sample to evaluate the protein in your urine and kidney function. We will call you with the results of these tests.  I will send a letter to the appropriate parties, informing them that we are managing your chronic health issues.   Please call our office anytime. Otherwise, you can follow up in 1 year for routine follow up.

## 2020-11-15 NOTE — Progress Notes (Signed)
   CC: new to establish and blood in urine  HPI:Mr.Ricky Velez is a 35 y.o. male who presents for evaluation of establish care and blood in urine. Please see individual problem based A/P for details.  Please see encounters tab for problem based charting.   Problem List Items Addressed This Visit       Cardiovascular and Mediastinum   Essential hypertension - Primary    Patient taking hyzaar 100-12.5. Blood pressure elevated 140's systolic. Denies any headache, vision changes, CP, SOB.  Increased hyzaar dose to 100-25.       Relevant Medications   losartan-hydrochlorothiazide (HYZAAR) 100-25 MG tablet   Other Relevant Orders   BMP8+Anion Gap (Completed)     Respiratory   OSA on CPAP    Patient reports home CPAP usage.  Denies fatigue today.  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 Has received first two covid vaccinations.    Depression, PHQ-9: Based on the patients  Flowsheet Row Office Visit from 11/15/2020 in [facility]  PHQ-9 Total Score 0      score we have 0.  Past Medical History:  Diagnosis Date   Asthma    Hypertension    Review of Systems:   Review of Systems  Constitutional: Negative.   HENT: Negative.    Eyes: Negative.   Respiratory: Negative.    Cardiovascular: Negative.   Gastrointestinal: Negative.   Genitourinary: Negative.   Musculoskeletal: Negative.   Skin: Negative.     Physical Exam: Vitals:   11/15/20 1558  BP: (!) 146/101  Pulse: 81  Temp: 98.5 F (36.9 C)  TempSrc: Oral  SpO2: 98%  Weight: 275 lb 14.4 oz (125.1 kg)  Height: 5\' 11"  (1.803 m)     General: alert and oriented, no acute distress. HEENT: Conjunctiva nl , antiicteric sclerae, moist  mucous membranes, no exudate or erythema Cardiovascular: Normal rate, regular rhythm.  No murmurs, rubs, or gallops Pulmonary : Equal breath sounds, No wheezes, rales, or rhonchi Abdominal: soft, nontender,  bowel sounds present Ext: No edema in lower extremities, no tenderness to palpation of lower extremities.   Assessment & Plan:   See Encounters Tab for problem based charting.  Patient seen with Dr. 

## 2020-11-16 LAB — BMP8+ANION GAP
Anion Gap: 20 mmol/L — ABNORMAL HIGH (ref 10.0–18.0)
BUN/Creatinine Ratio: 17 (ref 9–20)
BUN: 18 mg/dL (ref 6–20)
CO2: 22 mmol/L (ref 20–29)
Calcium: 9.6 mg/dL (ref 8.7–10.2)
Chloride: 95 mmol/L — ABNORMAL LOW (ref 96–106)
Creatinine, Ser: 1.06 mg/dL (ref 0.76–1.27)
Glucose: 77 mg/dL (ref 65–99)
Potassium: 3.7 mmol/L (ref 3.5–5.2)
Sodium: 137 mmol/L (ref 134–144)
eGFR: 93 mL/min/{1.73_m2} (ref >59)

## 2020-11-16 LAB — URINALYSIS, ROUTINE W REFLEX MICROSCOPIC
Bilirubin, UA: NEGATIVE
Glucose, UA: NEGATIVE
Leukocytes,UA: NEGATIVE
Nitrite, UA: NEGATIVE
Specific Gravity, UA: >=1.03 — AB (ref 1.005–1.030)
Urobilinogen, Ur: 0.2 mg/dL (ref 0.2–1.0)
pH, UA: 5 (ref 5.0–7.5)

## 2020-11-16 LAB — MICROSCOPIC EXAMINATION
Bacteria, UA: NONE SEEN
Casts: NONE SEEN /lpf
Epithelial Cells (non renal): NONE SEEN /hpf (ref 0–10)
WBC, UA: NONE SEEN /hpf (ref 0–5)

## 2020-11-19 ENCOUNTER — Encounter: Payer: Self-pay | Admitting: Internal Medicine

## 2020-11-19 DIAGNOSIS — G4733 Obstructive sleep apnea (adult) (pediatric): Secondary | ICD-10-CM | POA: Insufficient documentation

## 2020-11-19 NOTE — Assessment & Plan Note (Signed)
Patient taking hyzaar 100-12.5. Blood pressure elevated 140's systolic. Denies any headache, vision changes, CP, SOB.  Increased hyzaar dose to 100-25.

## 2020-11-19 NOTE — Assessment & Plan Note (Signed)
Patient reports home CPAP usage.  Denies fatigue today.  Continue home CPAP

## 2020-11-21 NOTE — Addendum Note (Signed)
Addended by: Adron Bene T on: 11/21/2020 04:13 PM   Modules accepted: Orders

## 2020-11-21 NOTE — Assessment & Plan Note (Signed)
Urine microscopy showed 3+ red blood cells and 3+ protein.  Patient is not reporting any flank pain or dysuria.  Reports that he has known about this blood in his urine for several years and was worked up back [place] country but cannot remember what was told at that time.  Will refer to nephrologist for further evaluation of microscopic hematuria and proteinuria.

## 2020-11-21 NOTE — Progress Notes (Signed)
Internal Medicine Clinic Attending  I saw and evaluated the patient.  I personally confirmed the key portions of the history and exam documented by Dr. Burnice Logan   and I reviewed pertinent patient test results.  The assessment, diagnosis, and plan were formulated together and I agree with the documentation in the resident's note. UA does confirm persistent microscopic hematuria with proteinuria, we discussed referral to nephrology

## 2020-11-27 ENCOUNTER — Encounter: Payer: 59 | Admitting: Internal Medicine

## 2020-11-27 NOTE — Progress Notes (Deleted)
   CC: Lab results and essential hypertension  HPI:Mr.Tami Barren Ruiz-Rios is a 35 y.o. male who presents for evaluation of lab results. Please see individual problem based A/P for details.  Essential hypertension Hyzaar was increased last office visit  Hematuria proteinuria Nephrology referral placed last visit  Depression, PHQ-9: Based on the patients  Flowsheet Row Office Visit from 11/15/2020 in [facility]  PHQ-9 Total Score 0      score we have ***.  Past Medical History:  Diagnosis Date   Asthma    Hypertension    Review of Systems:   ROS   Physical Exam: There were no vitals filed for this visit.   General: *** HEENT: Conjunctiva nl , antiicteric sclerae, moist mucous membranes, no exudate or erythema Cardiovascular: Normal rate, regular rhythm.  No murmurs, rubs, or gallops Pulmonary : Equal breath sounds, No wheezes, rales, or rhonchi Abdominal: soft, nontender,  bowel sounds present Ext: No edema in lower extremities, no tenderness to palpation of lower extremities.   Assessment & Plan:   See Encounters Tab for problem based charting.  Patient {GC/GE:[id number]::"discussed with","seen with"} Dr. {HYWVP:[id number]::"S. Hoffman","Guilloud","Mullen","Narendra","Raines","Vincent","Williams"}

## 2021-07-18 ENCOUNTER — Other Ambulatory Visit: Payer: Self-pay | Admitting: Internal Medicine

## 2021-07-18 DIAGNOSIS — I129 Hypertensive chronic kidney disease with stage 1 through stage 4 chronic kidney disease, or unspecified chronic kidney disease: Secondary | ICD-10-CM

## 2021-07-18 DIAGNOSIS — R809 Proteinuria, unspecified: Secondary | ICD-10-CM

## 2021-11-12 ENCOUNTER — Encounter: Payer: Self-pay | Admitting: *Deleted

## 2021-11-13 ENCOUNTER — Encounter: Payer: Self-pay | Admitting: Adult Health

## 2021-11-13 ENCOUNTER — Ambulatory Visit: Payer: Commercial Managed Care - HMO | Admitting: Adult Health

## 2021-11-13 VITALS — BP 126/80 | HR 79 | Ht 72.0 in | Wt 265.0 lb

## 2021-11-13 DIAGNOSIS — Z9989 Dependence on other enabling machines and devices: Secondary | ICD-10-CM

## 2021-11-13 DIAGNOSIS — G4733 Obstructive sleep apnea (adult) (pediatric): Secondary | ICD-10-CM | POA: Diagnosis not present

## 2021-11-13 NOTE — Progress Notes (Signed)
PATIENT: Ricky Velez DOB: 11-03-1985  REASON FOR VISIT: follow up HISTORY FROM: patient PRIMARY NEUROLOGIST: Dr. Frances Furbish  Chief Complaint  Patient presents with   Rm 18, CARD DL    Patient is here alone for CPAP follow-up. He states everything is good but he does express that he doesn't like the machine. ESS 1.     HISTORY OF PRESENT ILLNESS: Today 11/13/21:  Ricky Velez is a 36 year old male with a history of obstructive sleep apnea on CPAP.  He returns today for follow-up.  He does not like using the machine.  Reports that he has had a 25 pound weight loss since he was initially tested.  Would like to be retested in the hopes that he no longer has sleep apnea.    HISTORY (copied from Dr. Teofilo Pod note)  11/07/2020: I reviewed his AutoPap compliance data from 10/09/2020 through 11/07/2020, which is a total of 30 days, during which time he used his machine every night, denies usage is of course not there yet.  He is 100% compliant.  Average usage of 5.3 hours, average AHI at goal at 0.5/h, 95th percentile of pressure at 5.4 cm with a range of 5 to 12 cm, maximum pressure for the past 30 days at 6.6 cm, leak on the lower side with a 95th percentile at 1.3 L/min.  He reports using nasal pillows.  His wife has given him feedback that he barely snores any longer.  He has been adjusting to treatment, sometimes wakes up with discomfort and sometimes pulls at the mass.  He is still working on consistency refusing it but is very motivated to continue with it.  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  I would be happy to write a letter of support.  He is working on weight loss, reports that his mom had been staying with him, visiting from [place] and she was cooking a lot for them, he has had some weight gain.  He has had no changes in his medical history or medications.  His Epworth sleepiness score is 0 out of 24. REVIEW OF SYSTEMS:  Out of a complete 14 system review of symptoms, the patient complains only of the following symptoms, and all other reviewed systems are negative.   ESS 1  ALLERGIES: No Known Allergies  HOME MEDICATIONS: Outpatient Medications Prior to Visit  Medication Sig Dispense Refill   Olmesartan-amLODIPine-HCTZ 40-5-25 MG TABS Take 1 tablet by mouth daily.     losartan-hydrochlorothiazide (HYZAAR) 100-25 MG tablet Take 1 tablet by mouth daily. (Patient not taking: Reported on 11/13/2021) 30 tablet 11   No facility-administered medications prior to visit.    PAST MEDICAL HISTORY: Past Medical History:  Diagnosis Date   Asthma    Hypertension     PAST SURGICAL HISTORY: History reviewed. No pertinent surgical history.  FAMILY HISTORY: Family History  Problem Relation Age of Onset   Hypertension Mother    Diabetes Father    Hypertension Father    Diabetes Maternal Grandmother    Hypertension Maternal Grandfather    Diabetes Paternal Grandmother    Hypertension Paternal Grandfather     SOCIAL HISTORY: Social History   Socioeconomic History   Marital status: Married    Spouse name: Not on file   Number of children: Not on file   Years of education: Not on file   Highest education level: Not on file  Occupational  History   Not on file  Tobacco Use   Smoking status: Never   Smokeless tobacco: Never  Vaping Use   Vaping Use: Never used  Substance and Sexual Activity   Alcohol use: Not Currently   Drug use: No   Sexual activity: Yes  Other Topics Concern   Not on file  Social History Narrative   Lives at home with wife and two sons   Right handed   Caffeine: coffee 2-3 times per week   Social Determinants of Health   Financial Resource Strain: Not on file  Food Insecurity: Not on file  Transportation Needs: Not on file  Physical Activity: Not on file  Stress: Not on file  Social Connections: Not on file  Intimate Partner Violence: Not on file      PHYSICAL  EXAM  Vitals:   11/13/21 1446  BP: 126/80  Pulse: 79  Weight: 265 lb (120.2 kg)  Height: 6' (1.829 m)   Body mass index is 35.94 kg/m.  Generalized: Well developed, in no acute distress  Chest: Lungs clear to auscultation bilaterally  Neurological examination  Mentation: Alert oriented to time, place, history taking. Follows all commands speech and language fluent Gait and station: Gait is normal.    DIAGNOSTIC DATA (LABS, IMAGING, TESTING) - I reviewed patient records, labs, notes, testing and imaging myself where available.  Lab Results  Component Value Date   WBC 10.5 04/28/2019   HGB 16.0 04/28/2019   HCT 48.3 04/28/2019   MCV 86.9 04/28/2019   PLT 403 (H) 04/28/2019      Component Value Date/Time   NA 137 11/15/2020 1657   K 3.7 11/15/2020 1657   CL 95 (L) 11/15/2020 1657   CO2 22 11/15/2020 1657   GLUCOSE 77 11/15/2020 1657   GLUCOSE 85 04/28/2019 1008   BUN 18 11/15/2020 1657   CREATININE 1.06 11/15/2020 1657   CALCIUM 9.6 11/15/2020 1657   PROT 8.1 04/28/2019 1008   PROT 7.3 04/04/2019 1646   ALBUMIN 4.4 04/28/2019 1008   ALBUMIN 4.6 04/04/2019 1646   AST 25 04/28/2019 1008   ALT 28 04/28/2019 1008   ALKPHOS 76 04/28/2019 1008   BILITOT 1.1 04/28/2019 1008   BILITOT 0.3 04/04/2019 1646   GFRNONAA >60 04/28/2019 1008   GFRAA >60 04/28/2019 1008   Lab Results  Component Value Date   CHOL 218 (H) 04/04/2019   HDL 47 04/04/2019   LDLCALC 124 (H) 04/04/2019   TRIG 268 (H) 04/04/2019   CHOLHDL 4.6 04/04/2019   Lab Results  Component Value Date   HGBA1C 5.4 07/01/2019   No results found for: "VITAMINB12" Lab Results  Component Value Date   TSH 2.055 04/28/2019      ASSESSMENT AND PLAN 36 y.o. year old male  has a past medical history of Asthma and Hypertension. here with:  OSA on CPAP  - CPAP compliance excellent - Good treatment of AHI  - Encourage patient to use CPAP nightly and > 4 hours each night - F/U in 1 year or sooner if  needed     Butch Penny, MSN, NP-C 11/13/2021, 2:54 PM [facility] [address], [address] [place], [place] [address] [phone]

## 2021-12-04 ENCOUNTER — Telehealth: Payer: Self-pay | Admitting: Adult Health

## 2021-12-04 NOTE — Telephone Encounter (Signed)
[employer] no Josem Kaufmann req spoke to [place] ref # [id number]   phone # not working 11/27/21 [place]

## 2022-04-11 DIAGNOSIS — Z6837 Body mass index (BMI) 37.0-37.9, adult: Secondary | ICD-10-CM | POA: Diagnosis not present

## 2022-04-11 DIAGNOSIS — M25562 Pain in left knee: Secondary | ICD-10-CM | POA: Diagnosis not present

## 2022-05-02 DIAGNOSIS — G4733 Obstructive sleep apnea (adult) (pediatric): Secondary | ICD-10-CM | POA: Diagnosis not present

## 2022-06-02 DIAGNOSIS — G4733 Obstructive sleep apnea (adult) (pediatric): Secondary | ICD-10-CM | POA: Diagnosis not present

## 2022-07-02 DIAGNOSIS — G4733 Obstructive sleep apnea (adult) (pediatric): Secondary | ICD-10-CM | POA: Diagnosis not present

## 2022-07-24 ENCOUNTER — Telehealth: Payer: Self-pay | Admitting: Neurology

## 2022-07-24 NOTE — Telephone Encounter (Signed)
Unable to reach pt over the phone, invalid phone number. Sent mychart msg informing pt of need to reschedule 09/25/22 appt - MD out

## 2022-08-02 DIAGNOSIS — G4733 Obstructive sleep apnea (adult) (pediatric): Secondary | ICD-10-CM | POA: Diagnosis not present

## 2022-09-01 ENCOUNTER — Encounter: Payer: Self-pay | Admitting: *Deleted

## 2022-09-01 DIAGNOSIS — G4733 Obstructive sleep apnea (adult) (pediatric): Secondary | ICD-10-CM | POA: Diagnosis not present

## 2022-09-02 ENCOUNTER — Encounter: Payer: Self-pay | Admitting: *Deleted

## 2022-09-02 ENCOUNTER — Ambulatory Visit: Payer: 59 | Admitting: Neurology

## 2022-09-02 ENCOUNTER — Encounter: Payer: Self-pay | Admitting: Neurology

## 2022-09-02 VITALS — BP 134/80 | HR 84 | Ht 71.0 in | Wt 274.0 lb

## 2022-09-02 DIAGNOSIS — G4733 Obstructive sleep apnea (adult) (pediatric): Secondary | ICD-10-CM | POA: Diagnosis not present

## 2022-09-02 NOTE — Progress Notes (Signed)
Subjective:    Patient ID: Ricky Velez is a 37 y.o. male.  HPI    Interim history:   Ricky Velez is a 37 year old right-handed gentleman with an underlying medical history of hypertension, and obesity, who presents for follow-up consultation of his obstructive sleep apnea, on autoPAP therapy.  The patient is unaccompanied today. I last saw him on 11/07/2020, He was compliant with his AutoPap machine.  He was working on weight loss.  He was advised to follow-up routinely in 1 year.  He saw Butch Penny, NP on 11/13/2021, at which time he was compliant with his AutoPap and doing well.  He was advised to follow-up routinely in 1 year.  Today, 09/02/2022: I reviewed his AutoPap compliance data from 08/03/2022 through 09/01/2022, which is a total of 30 days, during which time he used his machine every night with percent use days greater than 4 hours at 93%, indicating compliance with an average usage of 6.6 hours, residual AHI at goal at 0.4/h, 95th percentile of pressure at 5.5 cm with a range of 5 to 12 cm with EPR of 3.  Leak on the low side with the 95th percentile at 0.1 mL/min.  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  He is due for an appointment in August and is also requesting a letter of support which we will be happy to provide for him.  He is up-to-date with his supplies, no issues with using the AutoPap, continues to benefit from it and uses nasal pillows without problems.  He is motivated to continue with treatment.  Sometimes he does not use the humidifier in the machine as the temperature becomes too hot.  He has it on the auto setting.    The patient's allergies, current medications, family history, past medical history, past social history, past surgical history and problem list were reviewed and updated as appropriate.    Previously:      I saw him on 08/08/2020, at which time we talked about his home sleep test from July 2021.  He  had moderate obstructive sleep apnea per AHI of 18.5, O2 nadir 76%.  He has been working on weight loss and has lost about 20 pounds compared to the year before.  He had a DOT physical coming up.  We talked about treatment options for sleep apnea and he was agreeable to starting AutoPap therapy.  His set up date was 10/09/2020.  He has a Resvent IBreeze 20A (compliance available through [employer]).   I reviewed his AutoPap compliance data from 10/09/2020 through 11/07/2020, which is a total of 30 days, during which time he used his machine every night, denies usage is of course not there yet.  He is 100% compliant.  Average usage of 5.3 hours, average AHI at goal at 0.5/h, 95th percentile of pressure at 5.4 cm with a range of 5 to 12 cm, maximum pressure for the past 30 days at 6.6 cm, leak on the lower side with a 95th percentile at 1.3 L/min.  Today, 11/07/2020: He reports using nasal pillows.  His wife has given him feedback that he barely snores any longer.  He has been adjusting to treatment, sometimes wakes up with discomfort and sometimes pulls at the mass.  He is still working on consistency refusing it but is very motivated to continue with it.  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  I would be happy to write a letter of support.  He is working on weight loss, reports that his mom had been staying with him, visiting from [place] and she was cooking a lot for them, he has had some weight gain.  He has had no changes in his medical history or medications.  His Epworth sleepiness score is 0 out of 24.   The patient's allergies, current medications, family history, past medical history, past social history, past surgical history and problem list were reviewed and updated as appropriate.    Previously:        I first met him at the request of his primary care physician on 08/03/2019, at which time the patient reported snoring, excessive daytime  somnolence, witnessed apneas and occasional morning headaches.  He was advised to proceed with a sleep study.  He had a home sleep test on 09/28/2019 which showed moderate obstructive sleep apnea with an estimated AHI of 18.5/h, O2 nadir 76%.  He was advised to start AutoPap therapy.  He did not start treatment at the time.     08/03/19: (He) reports snoring and excessive daytime somnolence as well as witnessed apneas per wife's report.  He has occasionally woken up with a headache.  He has nocturia about twice per average night.  I reviewed your recent office notes.  He has also recently started seeing cardiology because of an episode of chest pain.  He had an echocardiogram and myocardial perfusion scan on 07/27/2019.  He also had a renal artery ultrasound on 07/07/19. He reports a diagnosis of COVID-19 in December 2020.  He has had intermittent shortness of breath while awake and also at night at times.  Shortness of breath is not always associated with activity and may happen while resting.  He is currently off work.  He works as a Radiation protection practitioner.  He works 12-hour shifts, typically from 7 AM to 7 PM.  He is a lifelong non-smoker and drinks alcohol rarely, once every 6 months or so and drinks caffeine in the form of coffee, typically only 1 cup in the mornings.  He is married he lives with his wife and 2 children, ages 69 and 32.  They have 1 dog in the household.  He does not have a TV in the bedroom.  When he is working, his bedtime is around 8 PM and rise time around 4:30 AM.  He reports multiple nighttime awakenings which have become worse in the past 2 to 3 months.  He has also gained weight in the recent past.  He is not aware of any family history of OSA.  Snoring can be loud and disturbs his wife.  Epworth sleepiness score is 13 out of 24, fatigue severity score is 59 out of 63.       His Past Medical History Is Significant For: Past Medical History:  Diagnosis Date   Asthma    Hypertension     His  Past Surgical History Is Significant For: History reviewed. No pertinent surgical history.  His Family History Is Significant For: Family History  Problem Relation Age of Onset   Hypertension Mother    Diabetes Father    Hypertension Father    Diabetes Maternal Grandmother    Hypertension Maternal Grandfather    Diabetes Paternal Grandmother    Hypertension Paternal Grandfather     His Social History Is Significant For: Social History   Socioeconomic History   Marital status: Married  Spouse name: Not on file   Number of children: Not on file   Years of education: Not on file   Highest education level: Not on file  Occupational History   Not on file  Tobacco Use   Smoking status: Never   Smokeless tobacco: Never  Vaping Use   Vaping Use: Never used  Substance and Sexual Activity   Alcohol use: Not Currently   Drug use: No   Sexual activity: Yes  Other Topics Concern   Not on file  Social History Narrative   Lives at home with wife and two sons   Right handed   Caffeine: coffee 2-3 times per week   Social Determinants of Health   Financial Resource Strain: Not on file  Food Insecurity: Not on file  Transportation Needs: Not on file  Physical Activity: Not on file  Stress: Not on file  Social Connections: Not on file    His Allergies Are:  No Known Allergies:   His Current Medications Are:  Outpatient Encounter Medications as of 09/02/2022  Medication Sig   Olmesartan-amLODIPine-HCTZ 40-5-25 MG TABS Take 1 tablet by mouth daily.   acetaminophen (TYLENOL) 325 MG tablet Take 325 mg by mouth as needed.   No facility-administered encounter medications on file as of 09/02/2022.  :  Review of Systems:  Out of a complete 14 point review of systems, all are reviewed and negative with the exception of these symptoms as listed below:       Review of Systems  Neurological:        Patient is here for cpap follow-up. 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  He denies any trouble with the machine. ESS 0.    Objective:  Neurological Exam  Physical Exam Physical Examination:   Vitals:   09/02/22 0750  BP: 134/80  Pulse: 84    General Examination: The patient is a very pleasant 37 y.o. male in no acute distress. He appears well-developed and well-nourished and well groomed.   HEENT: Normocephalic, atraumatic, pupils are reactive to light, tracking well-preserved.  Hearing is grossly intact.  Face is symmetric with normal facial animation, speech is clear without dysarthria, hypophonia or voice tremor.  Neck with full range of motion, no carotid bruits.  Airway examination reveals stable findings, good dental hygiene, tongue protrudes centrally and palate elevates symmetrically, no significant mouth dryness noted.    Chest: Clear to auscultation without wheezing, rhonchi or crackles noted.   Heart: S1+S2+0, regular and normal without murmurs, rubs or gallops noted.    Abdomen: Soft, non-tender and non-distended.   Extremities: There is no pitting edema in the distal lower extremities bilaterally.      Skin: Warm and dry without trophic changes noted.    Musculoskeletal: exam reveals no obvious joint deformities.    Neurologically:  Mental status: The patient is awake, alert and oriented in all 4 spheres. His immediate and remote memory, attention, language skills and fund of knowledge are appropriate. There is no evidence of aphasia, agnosia, apraxia or anomia. Speech is clear with normal prosody and enunciation. Thought process is linear. Mood is normal and affect is normal.  Cranial nerves II - XII are as described above under HEENT exam.  Motor exam: Normal bulk, and strength grossly. Fine motor skills and coordination: grossly intact.  Cerebellar testing: No dysmetria or intention tremor. There is no truncal or gait ataxia.  Sensory exam: intact to light touch in the upper and lower extremities.  Gait, station and  balance: He stands  easily. No veering to one side is noted. No leaning to one side is noted. Posture is age-appropriate and stance is narrow based. Gait shows normal stride length and normal pace. No problems turning are noted.             Assessment and Plan:    In summary, Ricky Velez is a 37 year old male with an underlying medical history of hypertension since teenage years, and obesity, who presents for follow-up consultation of his obstructive sleep apnea, established on AutoPap therapy.  His home sleep test from 09/28/2019 showed an AHI of 18.5/h, O2 nadir 76%.  He is compliant with treatment.  He is commended for his treatment adherence.  We will provide a letter for his DOT physical which is pending for August 2024.  He will pick up the letter next week.  He was already provided with a AutoPap download report for the past year. He is encouraged to try to make time for sleep and use his AutoPap all night, every night.  He is tolerating the nasal pillows, leak from the mask is low, apnea scores excellent.  He is advised to follow-up routinely in this clinic to see one of our nurse practitioners in 1 year.  I answered all his questions today and he was in agreement.   I spent 30 minutes in total face-to-face time and in reviewing records during pre-charting, more than 50% of which was spent in counseling and coordination of care, reviewing test results, reviewing medications and treatment regimen and/or in discussing or reviewing the diagnosis of OSA, the prognosis and treatment options. Pertinent laboratory and imaging test results that were available during this visit with the patient were reviewed by me and considered in my medical decision making (see chart for details).

## 2022-09-02 NOTE — Patient Instructions (Signed)
Please continue using your autoPAP regularly. While your insurance requires that you use PAP at least 4 hours each night on 70% of the nights, I recommend, that you not skip any nights and use it throughout the night if you can. Getting used to PAP and staying with the treatment long term does take time and patience and discipline. Untreated obstructive sleep apnea when it is moderate to severe can have an adverse impact on cardiovascular health and raise her risk for heart disease, arrhythmias, hypertension, congestive heart failure, stroke and diabetes. Untreated obstructive sleep apnea causes sleep disruption, nonrestorative sleep, and sleep deprivation. This can have an impact on your day to day functioning and cause daytime sleepiness and impairment of cognitive function, memory loss, mood disturbance, and problems focussing. Using PAP regularly can improve these symptoms.   We can see you in 1 year, you can see one of our nurse practitioners as you are stable. 

## 2022-09-16 DIAGNOSIS — R809 Proteinuria, unspecified: Secondary | ICD-10-CM | POA: Diagnosis not present

## 2022-09-16 DIAGNOSIS — G4733 Obstructive sleep apnea (adult) (pediatric): Secondary | ICD-10-CM | POA: Diagnosis not present

## 2022-09-16 DIAGNOSIS — E669 Obesity, unspecified: Secondary | ICD-10-CM | POA: Diagnosis not present

## 2022-09-16 DIAGNOSIS — I1A0 Resistant hypertension: Secondary | ICD-10-CM | POA: Diagnosis not present

## 2022-09-16 DIAGNOSIS — R319 Hematuria, unspecified: Secondary | ICD-10-CM | POA: Diagnosis not present

## 2022-09-25 ENCOUNTER — Ambulatory Visit: Payer: Commercial Managed Care - HMO | Admitting: Neurology

## 2022-10-02 DIAGNOSIS — G4733 Obstructive sleep apnea (adult) (pediatric): Secondary | ICD-10-CM | POA: Diagnosis not present

## 2022-10-03 ENCOUNTER — Other Ambulatory Visit: Payer: Self-pay | Admitting: Internal Medicine

## 2022-10-03 DIAGNOSIS — G4733 Obstructive sleep apnea (adult) (pediatric): Secondary | ICD-10-CM

## 2022-10-03 DIAGNOSIS — R809 Proteinuria, unspecified: Secondary | ICD-10-CM

## 2022-10-03 DIAGNOSIS — E669 Obesity, unspecified: Secondary | ICD-10-CM

## 2022-10-03 DIAGNOSIS — R319 Hematuria, unspecified: Secondary | ICD-10-CM

## 2022-10-03 DIAGNOSIS — I1A0 Resistant hypertension: Secondary | ICD-10-CM

## 2022-10-15 ENCOUNTER — Other Ambulatory Visit: Payer: Self-pay

## 2022-10-27 ENCOUNTER — Ambulatory Visit
Admission: RE | Admit: 2022-10-27 | Discharge: 2022-10-27 | Disposition: A | Discharge status: Home / Self Care | Payer: 59 | Source: Ambulatory Visit | Attending: Internal Medicine | Admitting: Internal Medicine

## 2022-10-27 DIAGNOSIS — I1A0 Resistant hypertension: Secondary | ICD-10-CM

## 2022-10-27 DIAGNOSIS — R319 Hematuria, unspecified: Secondary | ICD-10-CM

## 2022-10-27 DIAGNOSIS — R809 Proteinuria, unspecified: Secondary | ICD-10-CM | POA: Diagnosis not present

## 2022-10-27 DIAGNOSIS — G4733 Obstructive sleep apnea (adult) (pediatric): Secondary | ICD-10-CM

## 2022-10-27 DIAGNOSIS — E669 Obesity, unspecified: Secondary | ICD-10-CM

## 2022-10-27 DIAGNOSIS — I10 Essential (primary) hypertension: Secondary | ICD-10-CM | POA: Diagnosis not present

## 2022-11-06 ENCOUNTER — Encounter: Payer: Self-pay | Admitting: *Deleted

## 2022-11-09 NOTE — Progress Notes (Deleted)
PATIENT: Hoy Lyday Ruiz-Rios DOB: 1986/01/26  REASON FOR VISIT: follow up HISTORY FROM: patient PRIMARY NEUROLOGIST:   HISTORY OF PRESENT ILLNESS: Today 11/09/22:  Henley Waheed Ruiz-Rios is a 37 y.o. male with a history of ***. Returns today for follow-up.   Home sleep test?    HISTORY   REVIEW OF SYSTEMS: Out of a complete 14 system review of symptoms, the patient complains only of the following symptoms, and all other reviewed systems are negative.  FSS ESS  ALLERGIES: No Known Allergies  HOME MEDICATIONS: Outpatient Medications Prior to Visit  Medication Sig Dispense Refill   acetaminophen (TYLENOL) 325 MG tablet Take 325 mg by mouth as needed.     Olmesartan-amLODIPine-HCTZ 40-5-25 MG TABS Take 1 tablet by mouth daily.     No facility-administered medications prior to visit.    PAST MEDICAL HISTORY: Past Medical History:  Diagnosis Date   Asthma    Hypertension     PAST SURGICAL HISTORY: No past surgical history on file.  FAMILY HISTORY: Family History  Problem Relation Age of Onset   Hypertension Mother    Diabetes Father    Hypertension Father    Diabetes Maternal Grandmother    Hypertension Maternal Grandfather    Diabetes Paternal Grandmother    Hypertension Paternal Grandfather     SOCIAL HISTORY: Social History   Socioeconomic History   Marital status: Married    Spouse name: Not on file   Number of children: Not on file   Years of education: Not on file   Highest education level: Not on file  Occupational History   Not on file  Tobacco Use   Smoking status: Never   Smokeless tobacco: Never  Vaping Use   Vaping status: Never Used  Substance and Sexual Activity   Alcohol use: Not Currently   Drug use: No   Sexual activity: Yes  Other Topics Concern   Not on file  Social History Narrative   Lives at home with wife and two sons   Right handed   Caffeine: coffee 2-3 times per week   Social Determinants of Health    Financial Resource Strain: Not on file  Food Insecurity: Not on file  Transportation Needs: Not on file  Physical Activity: Not on file  Stress: Not on file  Social Connections: Unknown (07/09/2021)   Received from [facility]   Social Network    Social Network: Not on file  Intimate Partner Violence: Unknown (06/07/2021)   Received from [facility]   HITS    Physically Hurt: Not on file    Insult or Talk Down To: Not on file    Threaten Physical Harm: Not on file    Scream or Curse: Not on file      PHYSICAL EXAM  There were no vitals filed for this visit. There is no height or weight on file to calculate BMI.  Generalized: Well developed, in no acute distress  Chest: Lungs clear to auscultation bilaterally  Neurological examination  Mentation: Alert oriented to time, place, history taking. Follows all commands speech and language fluent Cranial nerve II-XII: Extraocular movements were full, visual field were full on confrontational test Head turning and shoulder shrug  were normal and symmetric. Motor: The motor testing reveals 5 over 5 strength of all 4 extremities. Good symmetric motor tone is noted throughout.  Sensory: Sensory testing is intact to soft touch on all 4 extremities. No evidence of extinction is noted.  Gait and station: Gait  is normal.    DIAGNOSTIC DATA (LABS, IMAGING, TESTING) - I reviewed patient records, labs, notes, testing and imaging myself where available.  Lab Results  Component Value Date   WBC 10.5 04/28/2019   HGB 16.0 04/28/2019   HCT 48.3 04/28/2019   MCV 86.9 04/28/2019   PLT 403 (H) 04/28/2019      Component Value Date/Time   NA 137 11/15/2020 1657   K 3.7 11/15/2020 1657   CL 95 (L) 11/15/2020 1657   CO2 22 11/15/2020 1657   GLUCOSE 77 11/15/2020 1657   GLUCOSE 85 04/28/2019 1008   BUN 18 11/15/2020 1657   CREATININE 1.06 11/15/2020 1657   CALCIUM 9.6 11/15/2020 1657   PROT 8.1 04/28/2019 1008   PROT 7.3 04/04/2019  1646   ALBUMIN 4.4 04/28/2019 1008   ALBUMIN 4.6 04/04/2019 1646   AST 25 04/28/2019 1008   ALT 28 04/28/2019 1008   ALKPHOS 76 04/28/2019 1008   BILITOT 1.1 04/28/2019 1008   BILITOT 0.3 04/04/2019 1646   GFRNONAA >60 04/28/2019 1008   GFRAA >60 04/28/2019 1008   Lab Results  Component Value Date   CHOL 218 (H) 04/04/2019   HDL 47 04/04/2019   LDLCALC 124 (H) 04/04/2019   TRIG 268 (H) 04/04/2019   CHOLHDL 4.6 04/04/2019   Lab Results  Component Value Date   HGBA1C 5.4 07/01/2019   No results found for: "VITAMINB12" Lab Results  Component Value Date   TSH 2.055 04/28/2019      ASSESSMENT AND PLAN 37 y.o. year old male  has a past medical history of Asthma and Hypertension. here with:  OSA on CPAP  - CPAP compliance excellent - Good treatment of AHI  - Encourage patient to use CPAP nightly and > 4 hours each night - F/U in 1 year or sooner if needed   I spent *** minutes of face-to-face and non-face-to-face time with patient.  This included previsit chart review, lab review, study review, order entry, electronic health record documentation, patient education.  Butch Penny, MSN, NP-C 11/09/2022, 4:37 PM [facility] [address] [place], [place] [address] [phone]

## 2022-11-10 ENCOUNTER — Ambulatory Visit: Payer: Commercial Managed Care - HMO | Admitting: Adult Health

## 2022-12-23 DIAGNOSIS — R319 Hematuria, unspecified: Secondary | ICD-10-CM | POA: Diagnosis not present

## 2023-05-07 ENCOUNTER — Encounter: Payer: Self-pay | Admitting: Neurology

## 2023-08-20 ENCOUNTER — Encounter: Payer: Self-pay | Admitting: *Deleted

## 2023-08-24 ENCOUNTER — Encounter: Payer: Self-pay | Admitting: Adult Health

## 2023-08-24 ENCOUNTER — Ambulatory Visit (INDEPENDENT_AMBULATORY_CARE_PROVIDER_SITE_OTHER): Payer: Self-pay | Admitting: Adult Health

## 2023-08-24 VITALS — BP 142/76 | HR 88 | Ht 71.0 in | Wt 266.0 lb

## 2023-08-24 DIAGNOSIS — G4733 Obstructive sleep apnea (adult) (pediatric): Secondary | ICD-10-CM | POA: Diagnosis not present

## 2023-08-24 NOTE — Patient Instructions (Signed)
 Continue using CPAP nightly and greater than 4 hours each night If your symptoms worsen or you develop new symptoms please let us know.

## 2023-08-24 NOTE — Progress Notes (Signed)
 SABRA

## 2023-08-24 NOTE — Progress Notes (Signed)
 PATIENT: Ricky Velez DOB: October 16, 1985  REASON FOR VISIT: follow up HISTORY FROM: patient PRIMARY NEUROLOGIST: Dr. Buck  Chief Complaint  Patient presents with   Follow-up    Rm 20, cpap follow up     HISTORY OF PRESENT ILLNESS: Today 08/24/23:  Ricky Velez is a 38 y.o. male with a history of OSA on CPAP. Returns today for follow-up.  Reports that CPAP is working well for him.  He denies any new issues.  Download is below     HISTORY 09/02/2022: I reviewed his AutoPap compliance data from 08/03/2022 through 09/01/2022, which is a total of 30 days, during which time he used his machine every night with percent use days greater than 4 hours at 93%, indicating compliance with an average usage of 6.6 hours, residual AHI at goal at 0.4/h, 95th percentile of pressure at 5.5 cm with a range of 5 to 12 cm with EPR of 3.  Leak on the low side with the 95th percentile at 0.1 mL/min.  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  He is due for an appointment in August and is also requesting a letter of support which we will be happy to provide for him.  He is up-to-date with his supplies, no issues with using the AutoPap, continues to benefit from it and uses nasal pillows without problems.  He is motivated to continue with treatment.  Sometimes he does not use the humidifier in the machine as the temperature becomes too hot.  He has it on the auto setting.      REVIEW OF SYSTEMS: Out of a complete 14 system review of symptoms, the patient complains only of the following symptoms, and all other reviewed systems are negative.   ESS 0  ALLERGIES: No Known Allergies  HOME MEDICATIONS: Outpatient Medications Prior to Visit  Medication Sig Dispense Refill   acetaminophen  (TYLENOL ) 325 MG tablet Take 325 mg by mouth as needed.     Olmesartan-amLODIPine -HCTZ 40-5-25 MG TABS Take 1 tablet by mouth daily.     No facility-administered  medications prior to visit.    PAST MEDICAL HISTORY: Past Medical History:  Diagnosis Date   Asthma    Hypertension     PAST SURGICAL HISTORY: History reviewed. No pertinent surgical history.  FAMILY HISTORY: Family History  Problem Relation Age of Onset   Hypertension Mother    Diabetes Father    Hypertension Father    Diabetes Maternal Grandmother    Hypertension Maternal Grandfather    Diabetes Paternal Grandmother    Hypertension Paternal Grandfather     SOCIAL HISTORY: Social History   Socioeconomic History   Marital status: Married    Spouse name: Not on file   Number of children: Not on file   Years of education: Not on file   Highest education level: Not on file  Occupational History   Not on file  Tobacco Use   Smoking status: Never   Smokeless tobacco: Never  Vaping Use   Vaping status: Never Used  Substance and Sexual Activity   Alcohol use: Not Currently   Drug use: No   Sexual activity: Yes  Other Topics Concern   Not on file  Social History Narrative   Lives at home with wife and two sons   Right handed   Caffeine: coffee 2-3 times per week   Social Drivers of Corporate investment banker Strain: Not on  file  Food Insecurity: Not on file  Transportation Needs: Not on file  Physical Activity: Not on file  Stress: Not on file  Social Connections: Unknown (07/09/2021)   Received [facility]   Social Network    Social Network: Not on file  Intimate Partner Violence: Unknown (06/07/2021)   Received from [facility]   HITS    Physically Hurt: Not on file    Insult or Talk Down To: Not on file    Threaten Physical Harm: Not on file    Scream or Curse: Not on file      PHYSICAL EXAM  Vitals:   08/24/23 1350  BP: (!) 152/89  Pulse: 88  Weight: 266 lb (120.7 kg)  Height: 5' 11 (1.803 m)   Body mass index is 37.1 kg/m.  Generalized: Well developed, in no acute distress  Chest: Lungs clear to auscultation  bilaterally  Neurological examination  Mentation: Alert oriented to time, place, history taking. Follows all commands speech and language fluent Cranial nerve II-XII: Extraocular movements were full, visual field were full on confrontational test Head turning and shoulder shrug  were normal and symmetric. Motor: The motor testing reveals 5 over 5 strength of all 4 extremities. Good symmetric motor tone is noted throughout.  Sensory: Sensory testing is intact to soft touch on all 4 extremities. No evidence of extinction is noted.  Gait and station: Gait is normal.    DIAGNOSTIC DATA (LABS, IMAGING, TESTING) - I reviewed patient records, labs, notes, testing and imaging myself where available.  Lab Results  Component Value Date   WBC 10.5 04/28/2019   HGB 16.0 04/28/2019   HCT 48.3 04/28/2019   MCV 86.9 04/28/2019   PLT 403 (H) 04/28/2019      Component Value Date/Time   NA 137 11/15/2020 1657   K 3.7 11/15/2020 1657   CL 95 (L) 11/15/2020 1657   CO2 22 11/15/2020 1657   GLUCOSE 77 11/15/2020 1657   GLUCOSE 85 04/28/2019 1008   BUN 18 11/15/2020 1657   CREATININE 1.06 11/15/2020 1657   CALCIUM 9.6 11/15/2020 1657   PROT 8.1 04/28/2019 1008   PROT 7.3 04/04/2019 1646   ALBUMIN 4.4 04/28/2019 1008   ALBUMIN 4.6 04/04/2019 1646   AST 25 04/28/2019 1008   ALT 28 04/28/2019 1008   ALKPHOS 76 04/28/2019 1008   BILITOT 1.1 04/28/2019 1008   BILITOT 0.3 04/04/2019 1646   GFRNONAA >60 04/28/2019 1008   GFRAA >60 04/28/2019 1008   Lab Results  Component Value Date   CHOL 218 (H) 04/04/2019   HDL 47 04/04/2019   LDLCALC 124 (H) 04/04/2019   TRIG 268 (H) 04/04/2019   CHOLHDL 4.6 04/04/2019   Lab Results  Component Value Date   HGBA1C 5.4 07/01/2019   No results found for: VITAMINB12 Lab Results  Component Value Date   TSH 2.055 04/28/2019      ASSESSMENT AND PLAN 38 y.o. year old male  has a past medical history of Asthma and Hypertension. here with:  OSA on  CPAP  - CPAP compliance excellent - Good treatment of AHI  - Encourage patient to use CPAP nightly and > 4 hours each night - F/U in 1 year or sooner if needed    Duwaine Russell, MSN, NP-C 08/24/2023, 2:05 PM [facility] [address], [address] [place], [place] [address] [phone]

## 2023-09-08 ENCOUNTER — Ambulatory Visit: Payer: 59 | Admitting: Adult Health

## 2023-11-13 ENCOUNTER — Ambulatory Visit

## 2023-11-13 VITALS — BP 139/81 | HR 89 | Temp 98.2°F | Resp 16 | Ht 72.0 in | Wt 260.0 lb

## 2023-11-13 DIAGNOSIS — Z79899 Other long term (current) drug therapy: Secondary | ICD-10-CM | POA: Diagnosis not present

## 2023-11-13 DIAGNOSIS — I10 Essential (primary) hypertension: Secondary | ICD-10-CM

## 2023-11-13 DIAGNOSIS — R634 Abnormal weight loss: Secondary | ICD-10-CM

## 2023-11-13 DIAGNOSIS — Z131 Encounter for screening for diabetes mellitus: Secondary | ICD-10-CM

## 2023-11-13 DIAGNOSIS — Z7689 Persons encountering health services in other specified circumstances: Secondary | ICD-10-CM

## 2023-11-13 LAB — POCT GLYCOSYLATED HEMOGLOBIN (HGB A1C): Hemoglobin A1C: 5.4 % (ref 4.0–5.6)

## 2023-11-13 MED ORDER — PHENTERMINE HCL 15 MG PO CAPS: 15.0000 mg | ORAL_CAPSULE | ORAL | 0 refills | Status: DC

## 2023-11-13 MED ORDER — HYDROCHLOROTHIAZIDE 25 MG PO TABS: 25.0000 mg | ORAL_TABLET | Freq: Every day | ORAL | 1 refills | Status: DC

## 2023-11-13 MED ORDER — AMLODIPINE-OLMESARTAN 10-40 MG PO TABS: 1.0000 | ORAL_TABLET | Freq: Every day | ORAL | 1 refills | Status: AC

## 2023-11-13 NOTE — Progress Notes (Unsigned)
 Patient ID: Ricky Velez, male    DOB: Oct 27, 1985  MRN: [id number]  CC: Establish Care (Patient is here to established care with provider./~health hx address/~care gaps address/ )   Subjective: Ricky Velez is a 38 y.o. male with past medical history of hypertension who presents to clinic to establish care. Main concern ws difficulty losing weight. Pt reports he was 290 pounds and has dropped to 260 but for the past 6 months he has not been able to lose more weight. 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    Patient Active Problem List   Diagnosis Date Noted   OSA on CPAP 11/19/2020   Essential hypertension 04/17/2017   Proteinuria 04/17/2017     No current outpatient medications on file prior to visit.   No current facility-administered medications on file prior to visit.    No Known Allergies  Social History   Socioeconomic History   Marital status: Married    Spouse name: Not on file   Number of children: Not on file   Years of education: Not on file   Highest education level: Some college, no degree  Occupational History   Not on file  Tobacco Use   Smoking status: Never   Smokeless tobacco: Never  Vaping Use   Vaping status: Never Used  Substance and Sexual Activity   Alcohol use: Not Currently   Drug use: No   Sexual activity: Yes  Other Topics Concern   Not on file  Social History Narrative   Lives at home with wife and two sons   Right handed   Caffeine: coffee 2-3 times per week   Social Drivers of Health   Financial Resource Strain: Low Risk  (11/11/2023)   Overall Financial Resource Strain (CARDIA)    Difficulty of Paying Living Expenses: Not very hard  Food Insecurity: No Food Insecurity (11/11/2023)   Hunger Vital Sign    Worried About Running Out of Food in the Last Year: Never true    Ran Out of Food in the Last Year: Never true  Transportation Needs: No Transportation Needs  (11/11/2023)   PRAPARE - Administrator, Civil Service (Medical): No    Lack of Transportation (Non-Medical): No  Physical Activity: Sufficiently Active (11/11/2023)   Exercise Vital Sign    Days of Exercise per Week: 7 days    Minutes of Exercise per Session: 150+ min  Stress: No Stress Concern Present (11/11/2023)   [employer] of Occupational Health - Occupational Stress Questionnaire    Feeling of Stress: Not at all  Social Connections: Moderately Isolated (11/11/2023)   Social Connection and Isolation Panel    Frequency of Communication with Friends and Family: More than three times a week    Frequency of Social Gatherings with Friends and Family: Patient declined    Attends Religious Services: Patient declined    Database administrator or Organizations: No    Attends Engineer, structural: Not on file    Marital Status: Married  Catering manager Violence: Not At Risk (11/13/2023)   Humiliation, Afraid, Rape, and Kick questionnaire    Fear of Current or Ex-Partner: No    Emotionally Abused: No    Physically Abused: No    Sexually Abused: No    Family History  Problem Relation Age of Onset   Hypertension Mother    Diabetes Father    Hypertension Father  Diabetes Maternal Grandmother    Hypertension Maternal Grandfather    Diabetes Paternal Grandmother    Hypertension Paternal Grandfather     No past surgical history on file.  ROS: Review of Systems Negative except as stated above  PHYSICAL EXAM: BP 139/81   Pulse 89   Temp 98.2 F (36.8 C) (Oral)   Resp 16   Ht 6' (1.829 m)   Wt 260 lb (117.9 kg)   SpO2 96%   BMI 35.26 kg/m   Physical Exam  General: well-appearing, no acute distress Skin: no jaundice, rashes, or lesions Cardiovascular: regular heart rate and rhythm, normal S1/S2, no murmurs, gallops, or rubs, peripheral pulses 2+ bilaterally Chest: no skeletal deformity, lungs clear to auscultation bilaterally, equal breath sounds  bilaterally Abdomen: soft, non-distended, non-tender to palpation, no hepatomegaly, no splenomegaly, normoactive bowel sounds Musculoskeletal: normal gait Extremities: no peripheral edema   ASSESSMENT AND PLAN:  1. Encounter to establish care with new provider (Primary)  2. Essential hypertension BP: 139/81 not a goal. Pt agrees to keep a home log.  - amLODipine -olmesartan  (AZOR ) 10-40 MG tablet; Take 1 tablet by mouth daily.  Dispense: 90 tablet; Refill: 1 - hydrochlorothiazide  (HYDRODIURIL ) 25 MG tablet; Take 1 tablet (25 mg total) by mouth daily.  Dispense: 90 tablet; Refill: 1  3. Weight loss The patient will adhere to a reduced calorie diet of 1800 calories per day. The patient will continue lifestyle modifications including healthy diet and 150 minutes of moderate intensity exercise per week.   - Follow up in one month - Pt educated on expected weight loss of 3-6 pounds a month eith medication + lifestyle changes.  - phentermine  15 MG capsule; Take 1 capsule (15 mg total) by mouth every morning.  Dispense: 30 capsule; Refill: 0  4. Diabetes mellitus screening - POCT glycosylated hemoglobin (Hb A1C) is 5.4 which is normal.   Patient was given the opportunity to ask questions.  Patient verbalized understanding of the plan and was able to repeat key elements of the plan.   Orders Placed This Encounter  Procedures   POCT glycosylated hemoglobin (Hb A1C)     Requested Prescriptions   Signed Prescriptions Disp Refills   amLODipine -olmesartan  (AZOR ) 10-40 MG tablet 90 tablet 1    Sig: Take 1 tablet by mouth daily.   hydrochlorothiazide  (HYDRODIURIL ) 25 MG tablet 90 tablet 1    Sig: Take 1 tablet (25 mg total) by mouth daily.   phentermine  15 MG capsule 30 capsule 0    Sig: Take 1 capsule (15 mg total) by mouth every morning.    Return in about 1 month (around 12/13/2023) for physical, labs.  Sula Leavy Rode, PA-C

## 2023-12-22 ENCOUNTER — Ambulatory Visit (INDEPENDENT_AMBULATORY_CARE_PROVIDER_SITE_OTHER)

## 2023-12-22 VITALS — BP 121/82 | HR 95 | Temp 98.8°F | Resp 16 | Ht 72.0 in | Wt 258.0 lb

## 2023-12-22 DIAGNOSIS — Z136 Encounter for screening for cardiovascular disorders: Secondary | ICD-10-CM

## 2023-12-22 DIAGNOSIS — G4733 Obstructive sleep apnea (adult) (pediatric): Secondary | ICD-10-CM | POA: Diagnosis not present

## 2023-12-22 DIAGNOSIS — Z130 Encounter for screening for diseases of the blood and blood-forming organs and certain disorders involving the immune mechanism: Secondary | ICD-10-CM

## 2023-12-22 DIAGNOSIS — Z1329 Encounter for screening for other suspected endocrine disorder: Secondary | ICD-10-CM

## 2023-12-22 DIAGNOSIS — Z0000 Encounter for general adult medical examination without abnormal findings: Secondary | ICD-10-CM | POA: Diagnosis not present

## 2023-12-22 DIAGNOSIS — Z13220 Encounter for screening for lipoid disorders: Secondary | ICD-10-CM

## 2023-12-22 DIAGNOSIS — Z114 Encounter for screening for human immunodeficiency virus [HIV]: Secondary | ICD-10-CM

## 2023-12-22 DIAGNOSIS — Z1159 Encounter for screening for other viral diseases: Secondary | ICD-10-CM

## 2023-12-22 DIAGNOSIS — Z13228 Encounter for screening for other metabolic disorders: Secondary | ICD-10-CM

## 2023-12-22 DIAGNOSIS — I10 Essential (primary) hypertension: Secondary | ICD-10-CM

## 2023-12-22 MED ORDER — PHENTERMINE HCL 30 MG PO CAPS: 30.0000 mg | ORAL_CAPSULE | ORAL | 0 refills | Status: DC

## 2023-12-22 MED ORDER — HYDROCHLOROTHIAZIDE 12.5 MG PO TABS: 12.5000 mg | ORAL_TABLET | Freq: Every day | ORAL | 2 refills | Status: AC

## 2023-12-22 NOTE — Progress Notes (Signed)
 Patient ID: Ricky Velez-Rios, male    DOB: Jul 29, 1985  MRN: [id number]  CC: No chief complaint on file.   Subjective: Ricky Velez is a 38 y.o. male with past medical history of hypertension who presents to clinic for annual physical and labs.  Patient reports that phentermine  the weight loss medication has been working in controlling his cravings.  Reports that he is eating less sweets and smaller portions.  Otherwise no acute concerns   Patient Active Problem List   Diagnosis Date Noted   Morbid obesity (HCC) 12/22/2023   Obstructive sleep apnea syndrome 11/19/2020   Essential hypertension 04/17/2017   Proteinuria 04/17/2017     Current Outpatient Medications on File Prior to Visit  Medication Sig Dispense Refill   amLODipine -olmesartan  (AZOR ) 10-40 MG tablet Take 1 tablet by mouth daily. 90 tablet 1   No current facility-administered medications on file prior to visit.    No Known Allergies  ROS: Review of Systems Negative except as stated above  PHYSICAL EXAM: BP 121/82   Pulse 95   Temp 98.8 F (37.1 C) (Oral)   Resp 16   Ht 6' (1.829 m)   Wt 258 lb (117 kg)   SpO2 98%   BMI 34.99 kg/m   Physical Exam  General: well-appearing, no acute distress Skin: no jaundice, rashes, or lesions Head: normocephalic, no lesions, no abnormal hair distribution or loss Eyes: anicteric sclera, pupils equally round and reactive to light and accommodation, extraocular movements intact Ears: no external lesions, tympanic membrane translucent  Throat: trachea midline, no thyromegaly, moist mucus membranes Cardiovascular: regular heart rate and rhythm, normal S1/S2, no murmurs, gallops, or rubs, peripheral pulses 2+ bilaterally Chest: no skeletal deformity, lungs clear to auscultation bilaterally, equal breath sounds bilaterally Abdomen: soft, non-distended, non-tender to palpation, no hepatomegaly, no splenomegaly, normoactive bowel sounds Musculoskeletal: strength  of upper and lower extremities equal bilaterally, 5/5, normal gait Extremities: no peripheral edema, nails intact Neurologic: cranial nerves II-XII intact, brisk patellar reflexes   ASSESSMENT AND PLAN:  1. Encounter for annual wellness visit (Primary) - Complete physical exam performed today, no abnormal findings. - Routine labs to screen for heme, cardiovascular and metabolic disorders.  2. Morbid obesity (HCC) - Pt has lost 2 pounds since last visit and is feeling well overall.  Reports that he has gone down 1 pant size.  - phentermine  30 MG capsule; Take 1 capsule (30 mg total) by mouth every morning.  Dispense: 30 capsule; Refill: 0 - The patient will adhere to a reduced calorie diet of 180 calories per day. The patient will continue lifestyle modifications including healthy diet and 150 minutes of moderate intensity exercise per week.    3. Obstructive sleep apnea syndrome - Wears CPAP at night  4. Screening for blood disease - CBC  5. Screening for metabolic disorder - Comprehensive metabolic panel with GFR  6. Encounter for lipid screening for cardiovascular disease - Lipid panel  7. Thyroid  disorder screen - TSH Rfx on Abnormal to Free T4  8. Encounter for screening for HIV - HIV Antibody (routine testing w rflx)  9. Encounter for hepatitis C screening test for low risk patient - Hepatitis C antibody  10. Essential hypertension - BP: 121/82 which is at goal. Pt congratulated on this.  Per patient request decreasing dose of hydrochlorothiazide .  Patient reports ports that due to his job he does not always have access to a bathroom so he wants to decrease urinary frequency.  - hydrochlorothiazide  (  HYDRODIURIL ) 12.5 MG tablet; Take 1 tablet (12.5 mg total) by mouth daily.  Dispense: 90 tablet; Refill: 2   Patient was given the opportunity to ask questions.  Patient verbalized understanding of the plan and was able to repeat key elements of the plan.   Orders Placed  This Encounter  Procedures   CBC   Comprehensive metabolic panel with GFR   Lipid panel   HIV Antibody (routine testing w rflx)   Hepatitis C antibody   TSH Rfx on Abnormal to Free T4     Requested Prescriptions   Signed Prescriptions Disp Refills   phentermine  30 MG capsule 30 capsule 0    Sig: Take 1 capsule (30 mg total) by mouth every morning.   hydrochlorothiazide  (HYDRODIURIL ) 12.5 MG tablet 90 tablet 2    Sig: Take 1 tablet (12.5 mg total) by mouth daily.    Return in about 1 month (around 01/22/2024) for follow-up weight loss.  Sula Leavy Rode, PA-C

## 2023-12-23 ENCOUNTER — Ambulatory Visit: Payer: Self-pay

## 2023-12-23 LAB — LIPID PANEL
Chol/HDL Ratio: 4 ratio (ref 0.0–5.0)
Cholesterol, Total: 197 mg/dL (ref 100–199)
HDL: 49 mg/dL (ref >39)
LDL Chol Calc (NIH): 102 mg/dL — ABNORMAL HIGH (ref 0–99)
Triglycerides: 270 mg/dL — ABNORMAL HIGH (ref 0–149)
VLDL Cholesterol Cal: 46 mg/dL — ABNORMAL HIGH (ref 5–40)

## 2023-12-23 LAB — COMPREHENSIVE METABOLIC PANEL WITH GFR
ALT: 21 IU/L (ref 0–44)
AST: 17 IU/L (ref 0–40)
Albumin: 4.5 g/dL (ref 4.1–5.1)
Alkaline Phosphatase: 95 IU/L (ref 47–123)
BUN/Creatinine Ratio: 18 (ref 9–20)
BUN: 19 mg/dL (ref 6–24)
Bilirubin Total: 0.3 mg/dL (ref 0.0–1.2)
CO2: 19 mmol/L — ABNORMAL LOW (ref 20–29)
Calcium: 9.4 mg/dL (ref 8.7–10.2)
Chloride: 101 mmol/L (ref 96–106)
Creatinine, Ser: 1.07 mg/dL (ref 0.76–1.27)
Globulin, Total: 3.1 g/dL (ref 1.5–4.5)
Glucose: 98 mg/dL (ref 70–99)
Potassium: 4 mmol/L (ref 3.5–5.2)
Sodium: 141 mmol/L (ref 134–144)
Total Protein: 7.6 g/dL (ref 6.0–8.5)
eGFR: 90 mL/min/1.73 (ref >59)

## 2023-12-23 LAB — CBC
Hematocrit: 44.1 % (ref 37.5–51.0)
Hemoglobin: 14.8 g/dL (ref 13.0–17.7)
MCH: 29.9 pg (ref 26.6–33.0)
MCHC: 33.6 g/dL (ref 31.5–35.7)
MCV: 89 fL (ref 79–97)
Platelets: 386 x10E3/uL (ref 150–450)
RBC: 4.95 x10E6/uL (ref 4.14–5.80)
RDW: 13.3 % (ref 11.6–15.4)
WBC: 10.7 x10E3/uL (ref 3.4–10.8)

## 2023-12-23 LAB — HIV ANTIBODY (ROUTINE TESTING W REFLEX): HIV Screen 4th Generation wRfx: NONREACTIVE

## 2023-12-23 LAB — TSH RFX ON ABNORMAL TO FREE T4: TSH: 1.53 u[IU]/mL (ref 0.450–4.500)

## 2023-12-23 LAB — HEPATITIS C ANTIBODY: Hep C Virus Ab: NONREACTIVE

## 2024-02-04 ENCOUNTER — Ambulatory Visit

## 2024-02-04 DIAGNOSIS — I10 Essential (primary) hypertension: Secondary | ICD-10-CM

## 2024-02-04 DIAGNOSIS — G4733 Obstructive sleep apnea (adult) (pediatric): Secondary | ICD-10-CM

## 2024-02-04 MED ORDER — TIRZEPATIDE 2.5 MG/0.5ML ~~LOC~~ SOAJ: 2.5000 mg | SUBCUTANEOUS | 0 refills | Status: AC

## 2024-02-04 MED ORDER — TIRZEPATIDE 5 MG/0.5ML ~~LOC~~ SOAJ: 5.0000 mg | SUBCUTANEOUS | 0 refills | Status: AC

## 2024-02-04 NOTE — Progress Notes (Unsigned)
     Patient ID: Ricky Velez, male    DOB: [birth year]  MRN: [id number]  CC: Follow-up   Subjective: Ricky Velez is a 38 y.o. male with past medical history of hypertension and sleep apnea who presents to clinic for follow on weight loss.  Patient reports that he discontinued use of phentermine  30 mg due to difficulty sleeping due to being too alert. Reports that after 3 days of stopping medication he was able to return to regular sleep schedule.  No acute concerns at this time   No Known Allergies  ROS: Review of Systems Negative except as stated above  PHYSICAL EXAM: BP 122/83   Pulse 96   Temp 98.5 F (36.9 C)   Resp 18   Ht 6' (1.829 m)   Wt 263 lb (119.3 kg)   SpO2 97%   BMI 35.67 kg/m   Physical Exam  General: well-appearing, no acute distress Skin: no jaundice, rashes, or lesions Cardiovascular: regular heart rate and rhythm, normal S1/S2, no murmurs, gallops, or rubs, peripheral pulses 2+ bilaterally Chest: no skeletal deformity, lungs clear to auscultation bilaterally, equal breath sounds bilaterally Abdomen: soft, non-distended, non-tender to palpation, no hepatomegaly, no splenomegaly, normoactive bowel sounds Musculoskeletal: normal gait Extremities: no peripheral edema  ASSESSMENT AND PLAN:  1. Morbid obesity (HCC) (Primary) - The patient will adhere to a reduced calorie diet of 1800 calories per day. The patient will continue lifestyle modifications including healthy diet and 150 minutes of moderate intensity exercise per week.   - Discontinue Phentermine .  - First 4 weeks: start tirzepatide  (MOUNJARO ) 2.5 MG/0.5ML Pen; Inject 2.5 mg into the skin once a week.  Dispense: 2 mL; Refill: 0 - Next 4 weeks: Start tirzepatide  (MOUNJARO ) 5 MG/0.5ML Pen; Inject 5 mg into the skin once a week.  Dispense: 2 mL; Refill: 0 - Discussed medication side effects with patient. Patient expressed understanding.  2. Obstructive sleep apnea syndrome - Stable  with use of CPAP machine at night.  - tirzepatide  (MOUNJARO ) 2.5 MG/0.5ML Pen; Inject 2.5 mg into the skin once a week.  Dispense: 2 mL; Refill: 0 - tirzepatide  (MOUNJARO ) 5 MG/0.5ML Pen; Inject 5 mg into the skin once a week.  Dispense: 2 mL; Refill: 0  3. Essential hypertension - BP: 122/83 which is at goal - Continue hydrochlorothiazide  12.5mg  daily - Continue amlodipine -olmesartan  10-40 mg tablet daily    Patient was given the opportunity to ask questions.  Patient verbalized understanding of the plan and was able to repeat key elements of the plan.    No orders of the defined types were placed in this encounter.    Requested Prescriptions   Signed Prescriptions Disp Refills   tirzepatide  (MOUNJARO ) 2.5 MG/0.5ML Pen 2 mL 0    Sig: Inject 2.5 mg into the skin once a week.   tirzepatide  (MOUNJARO ) 5 MG/0.5ML Pen 2 mL 0    Sig: Inject 5 mg into the skin once a week.    Return in about 1 month (around 03/06/2024) for follow-up weight loss .  Ricky Leavy Rode, PA-C

## 2024-02-12 ENCOUNTER — Other Ambulatory Visit: Payer: Self-pay

## 2024-03-17 ENCOUNTER — Ambulatory Visit

## 2024-08-29 ENCOUNTER — Ambulatory Visit: Admitting: Adult Health
